# Patient Record
Sex: Male | Born: 1960 | Race: White | Hispanic: No | Marital: Married | Smoking: Never smoker
Health system: Southern US, Community
[De-identification: ages and names within clinical notes are randomized; demographics above are authoritative.]

## PROBLEM LIST (undated history)

## (undated) ENCOUNTER — Encounter

## (undated) ENCOUNTER — Ambulatory Visit

## (undated) ENCOUNTER — Ambulatory Visit
Payer: PRIVATE HEALTH INSURANCE | Attending: Student in an Organized Health Care Education/Training Program | Primary: Student in an Organized Health Care Education/Training Program

## (undated) ENCOUNTER — Telehealth

## (undated) ENCOUNTER — Telehealth: Attending: Clinical | Primary: Clinical

## (undated) DIAGNOSIS — Z7289 Other problems related to lifestyle: Secondary | ICD-10-CM

## (undated) DIAGNOSIS — IMO0001 Reserved for inherently not codable concepts without codable children: Secondary | ICD-10-CM

## (undated) DIAGNOSIS — Z531 Procedure and treatment not carried out because of patient's decision for reasons of belief and group pressure: Secondary | ICD-10-CM

## (undated) DIAGNOSIS — K3189 Other diseases of stomach and duodenum: Secondary | ICD-10-CM

## (undated) DIAGNOSIS — Z789 Other specified health status: Secondary | ICD-10-CM

## (undated) DIAGNOSIS — L719 Rosacea, unspecified: Secondary | ICD-10-CM

## (undated) DIAGNOSIS — F1090 Alcohol use, unspecified, uncomplicated: Secondary | ICD-10-CM

## (undated) DIAGNOSIS — D649 Anemia, unspecified: Secondary | ICD-10-CM

## (undated) DIAGNOSIS — E669 Obesity, unspecified: Secondary | ICD-10-CM

## (undated) DIAGNOSIS — R002 Palpitations: Secondary | ICD-10-CM

## (undated) DIAGNOSIS — R091 Pleurisy: Secondary | ICD-10-CM

## (undated) HISTORY — PX: ABDOMINAL SURGERY: SHX537

## (undated) HISTORY — PX: ESOPHAGOGASTRODUODENOSCOPY: SHX1529

## (undated) HISTORY — PX: TONSILLECTOMY: SUR1361

---
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
## 2016-06-25 DIAGNOSIS — R002 Palpitations: Secondary | ICD-10-CM

## 2016-06-25 DIAGNOSIS — E669 Obesity, unspecified: Secondary | ICD-10-CM

## 2016-06-25 HISTORY — DX: Palpitations: R00.2

## 2016-06-25 HISTORY — DX: Obesity, unspecified: E66.9

## 2017-01-23 DIAGNOSIS — D649 Anemia, unspecified: Secondary | ICD-10-CM

## 2017-01-23 HISTORY — DX: Anemia, unspecified: D64.9

## 2017-01-23 HISTORY — PX: ABDOMINAL SURGERY: SHX537

## 2017-02-04 ENCOUNTER — Encounter (HOSPITAL_COMMUNITY)
Admission: EM | Disposition: A | Discharge status: Other Acute Inpatient Hospital | Payer: Self-pay | Source: Home / Self Care | Attending: Emergency Medicine

## 2017-02-04 ENCOUNTER — Emergency Department (HOSPITAL_COMMUNITY): Payer: BLUE CROSS/BLUE SHIELD

## 2017-02-04 ENCOUNTER — Encounter (HOSPITAL_COMMUNITY): Payer: Self-pay

## 2017-02-04 ENCOUNTER — Observation Stay (HOSPITAL_COMMUNITY)
Admission: EM | Admit: 2017-02-04 | Discharge: 2017-02-05 | Payer: BLUE CROSS/BLUE SHIELD | Attending: Internal Medicine | Admitting: Internal Medicine

## 2017-02-04 ENCOUNTER — Inpatient Hospital Stay (HOSPITAL_COMMUNITY): Payer: BLUE CROSS/BLUE SHIELD | Admitting: Anesthesiology

## 2017-02-04 DIAGNOSIS — R188 Other ascites: Secondary | ICD-10-CM | POA: Insufficient documentation

## 2017-02-04 DIAGNOSIS — E871 Hypo-osmolality and hyponatremia: Principal | ICD-10-CM | POA: Insufficient documentation

## 2017-02-04 DIAGNOSIS — Z789 Other specified health status: Secondary | ICD-10-CM | POA: Diagnosis present

## 2017-02-04 DIAGNOSIS — D649 Anemia, unspecified: Secondary | ICD-10-CM | POA: Diagnosis not present

## 2017-02-04 DIAGNOSIS — R933 Abnormal findings on diagnostic imaging of other parts of digestive tract: Secondary | ICD-10-CM

## 2017-02-04 DIAGNOSIS — K319 Disease of stomach and duodenum, unspecified: Secondary | ICD-10-CM

## 2017-02-04 DIAGNOSIS — R0789 Other chest pain: Secondary | ICD-10-CM | POA: Insufficient documentation

## 2017-02-04 DIAGNOSIS — D490 Neoplasm of unspecified behavior of digestive system: Secondary | ICD-10-CM

## 2017-02-04 DIAGNOSIS — R109 Unspecified abdominal pain: Secondary | ICD-10-CM | POA: Insufficient documentation

## 2017-02-04 DIAGNOSIS — R079 Chest pain, unspecified: Secondary | ICD-10-CM | POA: Insufficient documentation

## 2017-02-04 DIAGNOSIS — K3189 Other diseases of stomach and duodenum: Secondary | ICD-10-CM | POA: Diagnosis not present

## 2017-02-04 DIAGNOSIS — IMO0001 Reserved for inherently not codable concepts without codable children: Secondary | ICD-10-CM | POA: Diagnosis present

## 2017-02-04 DIAGNOSIS — R1010 Upper abdominal pain, unspecified: Secondary | ICD-10-CM | POA: Diagnosis not present

## 2017-02-04 DIAGNOSIS — Z7289 Other problems related to lifestyle: Secondary | ICD-10-CM | POA: Diagnosis present

## 2017-02-04 DIAGNOSIS — D72829 Elevated white blood cell count, unspecified: Secondary | ICD-10-CM | POA: Diagnosis present

## 2017-02-04 DIAGNOSIS — R198 Other specified symptoms and signs involving the digestive system and abdomen: Secondary | ICD-10-CM

## 2017-02-04 HISTORY — DX: Other diseases of stomach and duodenum: K31.89

## 2017-02-04 HISTORY — DX: Anemia, unspecified: D64.9

## 2017-02-04 HISTORY — DX: Reserved for inherently not codable concepts without codable children: IMO0001

## 2017-02-04 HISTORY — DX: Other specified health status: Z78.9

## 2017-02-04 HISTORY — DX: Alcohol use, unspecified, uncomplicated: F10.90

## 2017-02-04 HISTORY — DX: Palpitations: R00.2

## 2017-02-04 HISTORY — DX: Procedure and treatment not carried out because of patient's decision for reasons of belief and group pressure: Z53.1

## 2017-02-04 HISTORY — DX: Pleurisy: R09.1

## 2017-02-04 HISTORY — DX: Rosacea, unspecified: L71.9

## 2017-02-04 HISTORY — DX: Obesity, unspecified: E66.9

## 2017-02-04 HISTORY — PX: ESOPHAGOGASTRODUODENOSCOPY: SHX5428

## 2017-02-04 HISTORY — DX: Other problems related to lifestyle: Z72.89

## 2017-02-04 LAB — DIFFERENTIAL
BASOS ABS: 0 10*3/uL (ref 0.0–0.1)
Basophils Relative: 0 %
EOS PCT: 1 %
Eosinophils Absolute: 0.1 10*3/uL (ref 0.0–0.7)
LYMPHS ABS: 1.3 10*3/uL (ref 0.7–4.0)
LYMPHS PCT: 9 %
Monocytes Absolute: 1.1 10*3/uL — ABNORMAL HIGH (ref 0.1–1.0)
Monocytes Relative: 7 %
NEUTROS ABS: 12.5 10*3/uL — AB (ref 1.7–7.7)
NEUTROS PCT: 83 %

## 2017-02-04 LAB — OSMOLALITY: Osmolality: 267 mOsm/kg — ABNORMAL LOW (ref 275–295)

## 2017-02-04 LAB — GLUCOSE, CAPILLARY: GLUCOSE-CAPILLARY: 129 mg/dL — AB (ref 65–99)

## 2017-02-04 LAB — APTT: APTT: 25 s (ref 24–36)

## 2017-02-04 LAB — CBC
HEMATOCRIT: 28.8 % — AB (ref 39.0–52.0)
Hemoglobin: 9.5 g/dL — ABNORMAL LOW (ref 13.0–17.0)
MCH: 27.7 pg (ref 26.0–34.0)
MCHC: 33 g/dL (ref 30.0–36.0)
MCV: 84 fL (ref 78.0–100.0)
PLATELETS: 181 10*3/uL (ref 150–400)
RBC: 3.43 MIL/uL — ABNORMAL LOW (ref 4.22–5.81)
RDW: 14.5 % (ref 11.5–15.5)
WBC: 15.1 10*3/uL — AB (ref 4.0–10.5)

## 2017-02-04 LAB — HEPATIC FUNCTION PANEL
ALT: 16 U/L — ABNORMAL LOW (ref 17–63)
AST: 29 U/L (ref 15–41)
Albumin: 3.8 g/dL (ref 3.5–5.0)
Alkaline Phosphatase: 38 U/L (ref 38–126)
Bilirubin, Direct: 0.1 mg/dL (ref 0.1–0.5)
Indirect Bilirubin: 0.7 mg/dL (ref 0.3–0.9)
Total Bilirubin: 0.8 mg/dL (ref 0.3–1.2)
Total Protein: 6.6 g/dL (ref 6.5–8.1)

## 2017-02-04 LAB — BASIC METABOLIC PANEL
Anion gap: 11 (ref 5–15)
BUN: 8 mg/dL (ref 6–20)
CO2: 22 mmol/L (ref 22–32)
Calcium: 8.2 mg/dL — ABNORMAL LOW (ref 8.9–10.3)
Chloride: 92 mmol/L — ABNORMAL LOW (ref 101–111)
Creatinine, Ser: 0.74 mg/dL (ref 0.61–1.24)
GFR calc Af Amer: >60 mL/min (ref >60)
GFR calc non Af Amer: >60 mL/min (ref >60)
Glucose, Bld: 156 mg/dL — ABNORMAL HIGH (ref 65–99)
Potassium: 3.6 mmol/L (ref 3.5–5.1)
Sodium: 125 mmol/L — ABNORMAL LOW (ref 135–145)

## 2017-02-04 LAB — I-STAT TROPONIN, ED: Troponin i, poc: 0 ng/mL (ref 0.00–0.08)

## 2017-02-04 LAB — SODIUM, URINE, RANDOM: Sodium, Ur: 39 mmol/L

## 2017-02-04 LAB — TYPE AND SCREEN
ABO/RH(D): O NEG
ANTIBODY SCREEN: NEGATIVE

## 2017-02-04 LAB — OSMOLALITY, URINE: OSMOLALITY UR: 350 mosm/kg (ref 300–900)

## 2017-02-04 LAB — PROTIME-INR
INR: 1.06
PROTHROMBIN TIME: 13.9 s (ref 11.4–15.2)

## 2017-02-04 LAB — HIV ANTIBODY (ROUTINE TESTING W REFLEX): HIV Screen 4th Generation wRfx: NONREACTIVE

## 2017-02-04 LAB — ABO/RH: ABO/RH(D): O NEG

## 2017-02-04 LAB — POC OCCULT BLOOD, ED: Fecal Occult Bld: NEGATIVE

## 2017-02-04 LAB — LIPASE, BLOOD: LIPASE: 29 U/L (ref 11–51)

## 2017-02-04 SURGERY — EGD (ESOPHAGOGASTRODUODENOSCOPY)
Anesthesia: Monitor Anesthesia Care

## 2017-02-04 MED ORDER — ONDANSETRON HCL 4 MG/2ML IJ SOLN: 4.0000 mg | Freq: Three times a day (TID) | INTRAMUSCULAR | Status: DC | PRN

## 2017-02-04 MED ORDER — PIPERACILLIN-TAZOBACTAM 3.375 G IVPB 30 MIN
3.3750 g | Freq: Once | INTRAVENOUS | Status: AC
  Administered 2017-02-04: 3.375 g via INTRAVENOUS
  Filled 2017-02-04: qty 50

## 2017-02-04 MED ORDER — LORAZEPAM 1 MG PO TABS: 1.0000 mg | ORAL_TABLET | Freq: Four times a day (QID) | ORAL | Status: DC | PRN

## 2017-02-04 MED ORDER — IOPAMIDOL (ISOVUE-300) INJECTION 61%
INTRAVENOUS | Status: AC
  Administered 2017-02-04: 100 mL
  Filled 2017-02-04: qty 100

## 2017-02-04 MED ORDER — LORAZEPAM 2 MG/ML IJ SOLN: 1.0000 mg | Freq: Four times a day (QID) | INTRAMUSCULAR | Status: DC | PRN

## 2017-02-04 MED ORDER — ADULT MULTIVITAMIN W/MINERALS CH: 1.0000 | ORAL_TABLET | Freq: Every day | ORAL | Status: DC

## 2017-02-04 MED ORDER — MORPHINE SULFATE (PF) 4 MG/ML IV SOLN: 2.0000 mg | INTRAVENOUS | Status: DC | PRN

## 2017-02-04 MED ORDER — ASPIRIN 81 MG PO CHEW
324.0000 mg | CHEWABLE_TABLET | Freq: Once | ORAL | Status: AC
Start: 2017-02-04 — End: 2017-02-04
  Administered 2017-02-04: 324 mg via ORAL
  Filled 2017-02-04: qty 4

## 2017-02-04 MED ORDER — LORAZEPAM 2 MG/ML IJ SOLN
0.0000 mg | Freq: Four times a day (QID) | INTRAMUSCULAR | Status: DC
Start: 2017-02-04 — End: 2017-02-05
  Administered 2017-02-04: 1 mg via INTRAVENOUS
  Filled 2017-02-04: qty 1

## 2017-02-04 MED ORDER — FOLIC ACID 1 MG PO TABS: 1.0000 mg | ORAL_TABLET | Freq: Every day | ORAL | Status: DC

## 2017-02-04 MED ORDER — SODIUM CHLORIDE 0.9 % IV SOLN
INTRAVENOUS | Status: DC
  Administered 2017-02-04 – 2017-02-05 (×2): via INTRAVENOUS

## 2017-02-04 MED ORDER — PIPERACILLIN-TAZOBACTAM 3.375 G IVPB 30 MIN: 3.3750 g | Freq: Three times a day (TID) | INTRAVENOUS | Status: DC

## 2017-02-04 MED ORDER — ACETAMINOPHEN 650 MG RE SUPP
650.0000 mg | Freq: Four times a day (QID) | RECTAL | Status: DC | PRN
Start: 2017-02-04 — End: 2017-02-05

## 2017-02-04 MED ORDER — LORAZEPAM 2 MG/ML IJ SOLN
0.0000 mg | Freq: Two times a day (BID) | INTRAMUSCULAR | Status: DC
Start: 2017-02-06 — End: 2017-02-05

## 2017-02-04 MED ORDER — ONDANSETRON HCL 4 MG/2ML IJ SOLN
4.0000 mg | Freq: Once | INTRAMUSCULAR | Status: AC
  Administered 2017-02-04: 4 mg via INTRAVENOUS
  Filled 2017-02-04: qty 2

## 2017-02-04 MED ORDER — VITAMIN B-1 100 MG PO TABS
100.0000 mg | ORAL_TABLET | Freq: Every day | ORAL | Status: DC
  Administered 2017-02-05: 100 mg via ORAL
  Filled 2017-02-04: qty 1

## 2017-02-04 MED ORDER — THIAMINE HCL 100 MG/ML IJ SOLN
100.0000 mg | Freq: Every day | INTRAMUSCULAR | Status: DC
  Filled 2017-02-04: qty 2

## 2017-02-04 MED ORDER — FOLIC ACID 5 MG/ML IJ SOLN
1.0000 mg | Freq: Every day | INTRAMUSCULAR | Status: DC
  Filled 2017-02-04 (×2): qty 0.2

## 2017-02-04 MED ORDER — PIPERACILLIN-TAZOBACTAM 3.375 G IVPB
3.3750 g | Freq: Three times a day (TID) | INTRAVENOUS | Status: DC
  Administered 2017-02-04 – 2017-02-05 (×5): 3.375 g via INTRAVENOUS
  Filled 2017-02-04 (×6): qty 50

## 2017-02-04 MED ORDER — MORPHINE SULFATE (PF) 4 MG/ML IV SOLN
4.0000 mg | Freq: Once | INTRAVENOUS | Status: AC
  Administered 2017-02-04: 4 mg via INTRAVENOUS
  Filled 2017-02-04: qty 1

## 2017-02-04 MED ORDER — PROPOFOL 500 MG/50ML IV EMUL
INTRAVENOUS | Status: DC | PRN
  Administered 2017-02-04: 100 ug/kg/min via INTRAVENOUS

## 2017-02-04 NOTE — Transfer of Care (Signed)
Immediate Anesthesia Transfer of Care Note  Patient: Samuel Clarke  Procedure(s) Performed: Procedure(s): ESOPHAGOGASTRODUODENOSCOPY (EGD) (N/A)  Patient Location: Endoscopy Unit  Anesthesia Type:General  Level of Consciousness: awake, alert  and oriented  Airway & Oxygen Therapy: Patient Spontanous Breathing and Patient connected to nasal cannula oxygen  Post-op Assessment: Report given to RN, Post -op Vital signs reviewed and stable and Patient moving all extremities  Post vital signs: Reviewed and stable  Last Vitals:  Vitals:   02/04/17 1300 02/04/17 1359  BP: (!) 155/68 (!) 128/54  Pulse: 84 97  Resp: 18 (!) 23  Temp: 37.1 C   SpO2: 98% 100%    Last Pain:  Vitals:   02/04/17 1300  TempSrc: Oral  PainSc:          Complications: No apparent anesthesia complications

## 2017-02-04 NOTE — Progress Notes (Signed)
PROGRESS NOTE    Samuel Clarke  YQM:[id number] DOB: 03-Jan-1961 DOA: 02/04/2017 PCP: Caroline More, MD   Chief Complaint  Patient presents with  . Chest Pain  . Shortness of Breath    Brief Narrative:  HPI on 02/04/2017 by Dr. Lorretta Harp Kodey [name] is a 56 y.o. male with medical history significant of Jehovah's witness, alcohol use, who presents with chest pain and abdominal pain. Pt is a poor historian. Pt states that he has been having chest discomfort for the whole day. He went out to dinner and had some pasta. After that, he states it felt like there was a lump of concrete in his left upper abdomen. He has nausea, no vomiting or diarrhea. He has pain in the lower chest and upper abdomen currently. The pain is constant, 8 out of 10 in severity, nonradiating. It is not aggravated or alleviated by any known factors. He also has some pain in his lower back. Patient denies shortness breath, cough, fever or chills. No symptoms of UTI or unilateral weakness. No hematochezia, hematemesis or hematuria. Patient states that he drinks 3 beers, mostly every day. He does not smoke cigarettes.  Assessment & Plan   Patient admitted earlier today by Dr. Lorretta Harp. See H&P for details  Abdominal pain/ Gastric mass with perforation -Presented with upper abdominal pain -CT abdomen and pelvis showed a large 7.6 x 7.3 x 6.3 cm mass posterior aspect of the gastric fundus suspicious for malignancy with associated perforation, given convex ascites throughout the abdomen and pelvis -General surgery and gastroenterology consulted and appreciated, pending further recommendations  -Gastroenterology possibly planning for EGD this afternoon -Currently NPO -continue IV fluids, will decrease -Continue antiemetics and pain control as needed  Hyponatremia -Sodium currently 129, improved from 125 on admission -Currently with no neurological deficits -Possibly due to alcohol use -TSH pending -Continue IV  fluids and monitor BMP  Alcohol use -Patient does appear to have mild tremor of his hands -Drinks 3 beers per evening -Continue CIWA -Will give IV thiamine and folic acid -hold multivitamin as patient is NPO  Normocytic anemia -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 surgery Gastroenterology  Procedures  None   LOS: 0 days   Time Spent in minutes   30 minutes  Ziare Orrick D.O. on 02/04/2017 at 1:09 PM  Between 7am to 7pm - Pager - [phone]  After 7pm go to [email or web address] - password [email or web address]  And look for the night coverage person covering for me after hours  [facility] Office  [phone]

## 2017-02-04 NOTE — Op Note (Signed)
[facility] Patient Name: Samuel Clarke Procedure Date : 02/04/2017 MRN: [id number] Attending MD: Meryl Dare , MD Date of Birth: 1961/01/27 CSN: [id number] Age: 56 Admit Type: Outpatient Procedure:                Upper GI endoscopy Indications:              Abnormal CT of the GI tract Providers:                Venita Lick. Russella Dar, MD, Jacquiline Doe, RN, Jacqulyn Liner, Technician Referring MD:             Suzanna Obey, MD Medicines:                Monitored Anesthesia Care Complications:            No immediate complications. Estimated Blood Loss:     Estimated blood loss was minimal. Procedure:                Pre-Anesthesia Assessment:                           - Prior to the procedure, a History and Physical                            was performed, and patient medications and                            allergies were reviewed. The patient's tolerance of                            previous anesthesia was also reviewed. The risks                            and benefits of the procedure and the sedation                            options and risks were discussed with the patient.                            All questions were answered, and informed consent                            was obtained. Prior Anticoagulants: The patient has                            taken no previous anticoagulant or antiplatelet                            agents. ASA Grade Assessment: III - A patient with                            severe systemic disease. After reviewing the risks  and benefits, the patient was deemed in                            satisfactory condition to undergo the procedure.                           After obtaining informed consent, the endoscope was                            passed under direct vision. Throughout the                            procedure, the patient's blood pressure, pulse, and                             oxygen saturations were monitored continuously. The                            EG-[id number] ([id number]) scope was introduced through the                            mouth, and advanced to the second part of duodenum.                            The upper GI endoscopy was accomplished without                            difficulty. The patient tolerated the procedure                            well. Scope In: Scope Out: Findings:      The examined esophagus was normal.      A large, submucosal mass with friable overlying mucosa with some oozing       following biopsy in the gastric fundus. Biopsy of mucosa overlying the       submucosal mass was taken with a cold forceps for histology-I doubt this       will provide a diagnosis.      Diffuse mildly erythematous mucosa without bleeding was found in the       gastric fundus and in the gastric body.      The exam of the stomach was otherwise normal.      The duodenal bulb and second portion of the duodenum were normal. Impression:               - Normal esophagus.                           - Rule out malignancy, submucosal gastric tumor in                            the gastric fundus. Mucosa overlying the mass was                            biopsied.                           -  Erythematous mucosa in the gastric fundus and                            gastric body.                           - Normal duodenal bulb and second portion of the                            duodenum. Moderate Sedation:      none/MAC Recommendation:           - Return patient to hospital ward for ongoing care.                           - Clear liquid diet today.                           - Continue present medications.                           - Await pathology results.                           - No aspirin, ibuprofen, naproxen, or other                            non-steroidal anti-inflammatory drugs for 2 weeks.                           - Surgical  mgmt. Procedure Code(s):        --- Professional ---                           [phone], Esophagogastroduodenoscopy, flexible,                            transoral; with biopsy, single or multiple Diagnosis Code(s):        --- Professional ---                           D49.0, Neoplasm of unspecified behavior of                            digestive system                           K31.89, Other diseases of stomach and duodenum                           R93.3, Abnormal findings on diagnostic imaging of                            other parts of digestive tract CPT copyright 2016 American Medical Association. All rights reserved. The codes documented in this report are preliminary and upon coder review may  be revised to meet current compliance requirements. Meryl Dare, MD 02/04/2017 2:00:11 PM This report has been  signed electronically. Number of Addenda: 0

## 2017-02-04 NOTE — ED Triage Notes (Signed)
Patient with chest pain that started earlier today.  Patient states that he went out to eat and the food became like a "cement block" in his stomach and vomited a couple of times this evening.  He states that he was uncomfortable, having back pain during this episode.  Patient was diaphoretic during this episodes.  He initially drank some seltzer water to help with the feeling of belching which did help.  He also felt syncopal and wife stated that he almost passed out at one point.

## 2017-02-04 NOTE — Anesthesia Procedure Notes (Signed)
Procedure Name: MAC Date/Time: 02/04/2017 1:37 PM Performed by: Kyung Rudd Pre-anesthesia Checklist: Patient identified, Emergency Drugs available, Suction available and Patient being monitored Patient Re-evaluated:Patient Re-evaluated prior to induction Oxygen Delivery Method: Nasal cannula Induction Type: IV induction Placement Confirmation: positive ETCO2 Dental Injury: Teeth and Oropharynx as per pre-operative assessment

## 2017-02-04 NOTE — Consult Note (Signed)
[facility] Consult: 11:50 AM 02/04/2017  LOS: 0 days    Referring Provider: Dr. Georgette Dover  Primary Care Physician:  Bettey Mare, MD at outpatient [facility] clinic in [place]. Primary Gastroenterologist:  Althia Forts    Reason for Consultation:  Gastric mass.  Dr Georgette Dover requests upper endoscopy for evaluation   HPI: Samuel Clarke is a 56 y.o. male.  He is a Restaurant manager, fast food. PMH of obesity.  History cardiac palpitations in 06/2016. These resolved and plans for what sounds like outpatient Holter monitor were canceled.  Remote tonsillectomy as a 23 or 23-year-old.  GI-wise patient has no previous history and no previous symptoms. Earlier this year, he went on a weight loss diet and lost about 20 pounds but this stabilized. Yesterday morning patient felt some slight pressure in his chest and possible shortness of breath. After church he ate fettuccine Alfredo at the [employer] at which point the lower chest pain recurred and radiated into his left upper quadrant, shoulders and back.Marland Kitchen He felt bloated. He wasn't particularly nauseated but he felt like the food had just stuck in his stomach and that if he could throw up he would feel better.  When he got home he drank a bunch of water and try to bring up gastric contents but all he was able to produce was some small amount of phlegm but he felt a little bit better. Later in the afternoon he went and sat on the commode and had a syncopal spell. He briefly lost consciousness and slid off the toilet. At that point the pain was in the left upper abdomen and no longer in the chest. He was diaphoretic.  Contrasted CT abdomen pelvis with 7.6 x 7.3 x 6.3 cm gastric mass in the posterior fundus, diffuse gastric wall thickening.  Mass is suspicious for malignancy. Possible perforation  given presence of complex, small to moderate volume ascites throughout abdomen and pelvis.  Radiology unable to distinguish if ascites represents gastric contents or blood. Soft tissue inflammation tracking near the splenic flexure of colon likely reflecting mesenteric edema secondary to perforation. HGB 9.5, MCV 84.  Hemoglobin 13 point 5 on 07/20/16.  Coags and platelets unremarkable. Sodium low at 125. Glucose elevated 156. LFTs, Lipase not elevated.  FOBT negative. Dr. Grandville Silos of general surgery has seen the patient and feels that the mass has not perforated and the fluid represents hemoperitoneum.  Patient doesn't use nonsteroidal anti-inflammatories. He drinks 3 beers per night. He thinks his mother may have had problems with stomach ulcers but isn't sure. He himself has never undergone colonoscopy or upper endoscopy.  Stool FOBT testing in 07/2016 reveals FOBT was negative   Past Medical History:  Diagnosis Date  . Alcohol use   . Heart palpitations 06/2016  . Normocytic anemia 01/2017  . Obesity 06/2016  . Patient is Jehovah's Witness     Past Surgical History:  Procedure Laterality Date  . TONSILLECTOMY     At age 73 or 7    Prior to Admission medications   Not on File  Scheduled Meds: . folic acid  1 mg Intravenous Daily  . LORazepam  0-4 mg Intravenous Q6H   Followed by  . [START ON 02/06/2017] LORazepam  0-4 mg Intravenous Q12H  . thiamine  100 mg Oral Daily   Or  . thiamine  100 mg Intravenous Daily   Infusions: . sodium chloride 125 mL/hr at 02/04/17 0640  . piperacillin-tazobactam (ZOSYN)  IV     PRN Meds: acetaminophen, LORazepam **OR** LORazepam, morphine injection, ondansetron (ZOFRAN) IV   Allergies as of 02/04/2017  . (No Known Allergies)    Family History  Problem Relation Age of Onset  . Diabetes Mellitus II Mother   . Transient ischemic attack Father   . Diabetes Mellitus II Sister     Social History   Social History  . Marital status:  Married    Spouse name: N/A  . Number of children: N/A  . Years of education: N/A   Occupational History  . Not on file.   Social History Main Topics  . Smoking status: Never Smoker  . Smokeless tobacco: Not on file  . Alcohol use Yes  . Drug use: No  . Sexual activity: Not on file   Other Topics Concern  . Not on file   Social History Narrative  . No narrative on file    REVIEW OF SYSTEMS: Constitutional:  Per HPI.  Patient generally has no weakness or fatigue. 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 ENT:  No nose bleeds Pulm:  No shortness of breath or cough. CV:  No palpitations, no LE edema. No chest pain since early yesterday. GU:  No hematuria, no frequency GI:  Per HPI Heme:  No unusual bleeding or bruising.   Transfusions:  No previous blood transfusions. He has a Sales promotion account executive Witness and thus would never consent to receiving blood products. Neuro: Presyncope as described above No headaches, no peripheral tingling or numbness Derm:  No itching, no rash or sores.  Endocrine:  No sweats or chills.  No polyuria or dysuria Immunization:  Did not inquire as to recent or previous immunization. Travel:  None beyond local counties in last few months.    PHYSICAL EXAM: Vital signs in last 24 hours: Vitals:   02/04/17 0530 02/04/17 0609  BP: 118/67 121/63  Pulse: 72 79  Resp: (!) 25 16  Temp:  98.5 F (36.9 C)  SpO2: 100% 100%   Wt Readings from Last 3 Encounters:  No data found for Wt    General: Obese, alert, pleasant, comfortable, not ill appearing WM Head:  No asymmetry or facial swelling. There is a reddened patch about the size of a dollar coin on his right cheek  Eyes:  No scleral icterus. No conjunctival pallor. EOMI. Ears:  Not hard of hearing  Nose:  No congestion, no discharge Mouth:  Tongue midline. Oral mucosa moist and clear. Dentition good. Neck:  No JVD, no masses, no TMG Lungs:  Clear bilaterally. No  shortness of breath. No cough. Heart: RRR. No MRG. S1, S2 present Abdomen:  Obese. Soft. No palpable masses. No HSM. Bowel sounds active. No hernias. Tenderness at the epigastric region and on the right upper quadrant more so than the left upper quadrant. No guarding or rebound.   Rectal: Deferred   Musc/Skeltl: No joint redness, swelling or gross deformity Extremities:  No CCE.  Neurologic:  Alert. Oriented times 3. Good historian. Moves all 4 limbs. No tremor or gross weakness. Skin:  No telangiectasia or suspicious lesions. No significant hematoma/bruising Tattoos:  None observed Nodes:  No cervical adenopathy.   Psych:  Pleasant, calm, cooperative.  Intake/Output from previous day: 08/12 0701 - 08/13 0700 In: 50 [IV Piggyback:50] Out: 200 [Urine:200] Intake/Output this shift: Total I/O In: -  Out: 600 [Urine:600]  LAB RESULTS:  Recent Labs  02/04/17 0231  WBC 15.1*  HGB 9.5*  HCT 28.8*  PLT 181   BMET Lab Results  Component Value Date   NA 125 (L) 02/04/2017   K 3.6 02/04/2017   CL 92 (L) 02/04/2017   CO2 22 02/04/2017   GLUCOSE 156 (H) 02/04/2017   BUN 8 02/04/2017   CREATININE 0.74 02/04/2017   CALCIUM 8.2 (L) 02/04/2017   LFT  Recent Labs  02/04/17 0213  PROT 6.6  ALBUMIN 3.8  AST 29  ALT 16*  ALKPHOS 38  BILITOT 0.8  BILIDIR 0.1  IBILI 0.7   PT/INR Lab Results  Component Value Date   INR 1.06 02/04/2017   Lipase     Component Value Date/Time   LIPASE 29 02/04/2017 0213    Drugs of Abuse  No results found for: LABOPIA, COCAINSCRNUR, LABBENZ, AMPHETMU, THCU, LABBARB   RADIOLOGY STUDIES: Ct Abdomen Pelvis W Contrast  Result Date: 02/04/2017 CLINICAL DATA:  Acute onset of vomiting and generalized chest pain. Diaphoresis and near syncope. Initial encounter. EXAM: CT ABDOMEN AND PELVIS WITH CONTRAST TECHNIQUE: Multidetector CT imaging of the abdomen and pelvis was performed using the standard protocol following bolus administration of  intravenous contrast. CONTRAST:  183mL ISOVUE-300 IOPAMIDOL (ISOVUE-300) INJECTION 61% COMPARISON:  None. FINDINGS: Lower chest: The visualized lung bases are clear. Scattered coronary artery calcifications are seen. Hepatobiliary: The liver is unremarkable in appearance. The gallbladder is unremarkable in appearance. The common bile duct remains normal in caliber. Mild complex ascites is seen tracking about the liver. Pancreas: The pancreas is grossly unremarkable in appearance. Diffuse soft tissue edema is noted tracking about the pancreas, though it appears separate from the pancreas. Spleen: Complex fluid is noted tracking about the spleen. The spleen is grossly unremarkable. Adrenals/Urinary Tract: The adrenal glands are grossly unremarkable in appearance, though the left adrenal gland is not well assessed due to the adjacent mass described below. The kidneys are unremarkable in appearance. There is no evidence of hydronephrosis. No renal or ureteral stones are identified. No perinephric stranding is seen. Stomach/Bowel: There is a large 7.6 x 7.3 x 6.3 cm mass noted at the posterior aspect of the gastric fundus. On sagittal images, this appears to arise within the gastric wall. This is suspicious for malignancy, with associated perforation, given complex ascites throughout the abdomen and pelvis. It is difficult to determine whether the ascites reflects primarily gastric contents or blood. There is no evidence of active contrast extravasation at this time. There appears to be diffuse gastric wall thickening, concerning for underlying gastritis. The small bowel is grossly unremarkable in appearance, aside from fluid tracking about small bowel loops. The appendix is normal in caliber, without evidence of appendicitis. The colon is unremarkable in appearance. Soft tissue inflammation tracks about the splenic flexure of the colon, reflecting underlying mesenteric edema secondary to the perforation.  Vascular/Lymphatic: Scattered calcification is seen along the abdominal aorta and its branches. The abdominal aorta is otherwise grossly unremarkable. The inferior vena cava is grossly unremarkable. No retroperitoneal lymphadenopathy is seen. No pelvic sidewall lymphadenopathy is identified. Reproductive: The bladder is mildly distended and grossly unremarkable. The prostate remains normal in size. Other:  As described above, there is small to moderate volume complex relatively high attenuation ascites within the abdomen and pelvis. This could reflect gastric contents, though the lack of free intraperitoneal air is unusual. Alternatively, it could reflect hemorrhage from the gastric mass. Musculoskeletal: No acute osseous abnormalities are identified. The visualized musculature is unremarkable in appearance. IMPRESSION: 1. Large 7.6 x 7.3 x 6.3 cm mass at the posterior aspect of the gastric fundus. On sagittal images, this appears to arise within the gastric wall. This is suspicious for malignancy, with associated perforation, given complex ascites throughout the abdomen and pelvis. 2. It is difficult to determine whether the small to moderate volume complex ascites reflects primarily gastric contents or blood. The lack of free intraperitoneal air suggests against gastric contents. Hemorrhage from the gastric mass could have such an appearance. 3. Diffuse soft tissue edema tracks about the mesentery of the upper abdomen, likely reflecting the perforation of the adjacent gastric mass. 4. Scattered coronary artery calcifications noted. 5. Scattered aortic atherosclerosis. Critical Value/emergent results were called by telephone at the time of interpretation on 02/04/2017 at 4:25 am to Dr. Delora Fuel, who verbally acknowledged these results. Electronically Signed   By: Garald Balding M.D.   On: 02/04/2017 04:37   Dg Chest Portable 1 View  Result Date: 02/04/2017 CLINICAL DATA:  Chest pain with shortness of breath  EXAM: PORTABLE CHEST 1 VIEW COMPARISON:  None. FINDINGS: The heart size and mediastinal contours are within normal limits. Both lungs are clear. The visualized skeletal structures are unremarkable. IMPRESSION: No active disease. Electronically Signed   By: Donavan Foil M.D.   On: 02/04/2017 02:48    ENDOSCOPIC STUDIES: None ever  IMPRESSION:   *  Symptomatic gastric mass. Question perforation versus associated bleeding and hematoma  *  Normocytic anemia.  *  Hyponatremia    PLAN:     *  EGD this afternoon. Risk of causing and/or exacerbating existing perforation discussed with the patient and his wife but they are agreeable to proceed. Patient has been npo.     Azucena Freed  02/04/2017, 11:50 AM Pager: [phone]     Attending physician's note   I have taken a history, examined the patient and reviewed the chart. I agree with the Advanced Practitioner's note, impression and recommendations. Large gastric fundus mass with possible intrapertoneal hemorrhage, penetration , perforation. Normocytic anemia. Hyponatremia. EGD today to further evaluate.   Lucio Edward, MD [facility] [phone] Mon-Fri 8a-5p [phone] after 5p, weekends, holidays

## 2017-02-04 NOTE — H&P (View-Only) (Signed)
[facility] Consult: 11:50 AM 02/04/2017  LOS: 0 days    Referring Provider: Dr. Georgette Dover  Primary Care Physician:  Bettey Mare, MD at outpatient [facility] clinic in [place]. Primary Gastroenterologist:  Althia Forts    Reason for Consultation:  Gastric mass.  Dr Georgette Dover requests upper endoscopy for evaluation   HPI: Samuel Clarke is a 56 y.o. male.  He is a Restaurant manager, fast food. PMH of obesity.  History cardiac palpitations in 06/2016. These resolved and plans for what sounds like outpatient Holter monitor were canceled.  Remote tonsillectomy as a 80 or 76-year-old.  GI-wise patient has no previous history and no previous symptoms. Earlier this year, he went on a weight loss diet and lost about 20 pounds but this stabilized. Yesterday morning patient felt some slight pressure in his chest and possible shortness of breath. After church he ate fettuccine Alfredo at the [employer] at which point the lower chest pain recurred and radiated into his left upper quadrant, shoulders and back.Marland Kitchen He felt bloated. He wasn't particularly nauseated but he felt like the food had just stuck in his stomach and that if he could throw up he would feel better.  When he got home he drank a bunch of water and try to bring up gastric contents but all he was able to produce was some small amount of phlegm but he felt a little bit better. Later in the afternoon he went and sat on the commode and had a syncopal spell. He briefly lost consciousness and slid off the toilet. At that point the pain was in the left upper abdomen and no longer in the chest. He was diaphoretic.  Contrasted CT abdomen pelvis with 7.6 x 7.3 x 6.3 cm gastric mass in the posterior fundus, diffuse gastric wall thickening.  Mass is suspicious for malignancy. Possible perforation  given presence of complex, small to moderate volume ascites throughout abdomen and pelvis.  Radiology unable to distinguish if ascites represents gastric contents or blood. Soft tissue inflammation tracking near the splenic flexure of colon likely reflecting mesenteric edema secondary to perforation. HGB 9.5, MCV 84.  Hemoglobin 13 point 5 on 07/20/16.  Coags and platelets unremarkable. Sodium low at 125. Glucose elevated 156. LFTs, Lipase not elevated.  FOBT negative. Dr. Grandville Silos of general surgery has seen the patient and feels that the mass has not perforated and the fluid represents hemoperitoneum.  Patient doesn't use nonsteroidal anti-inflammatories. He drinks 3 beers per night. He thinks his mother may have had problems with stomach ulcers but isn't sure. He himself has never undergone colonoscopy or upper endoscopy.  Stool FOBT testing in 07/2016 reveals FOBT was negative   Past Medical History:  Diagnosis Date  . Alcohol use   . Heart palpitations 06/2016  . Normocytic anemia 01/2017  . Obesity 06/2016  . Patient is Jehovah's Witness     Past Surgical History:  Procedure Laterality Date  . TONSILLECTOMY     At age 44 or 7    Prior to Admission medications   Not on File  Scheduled Meds: . folic acid  1 mg Intravenous Daily  . LORazepam  0-4 mg Intravenous Q6H   Followed by  . [START ON 02/06/2017] LORazepam  0-4 mg Intravenous Q12H  . thiamine  100 mg Oral Daily   Or  . thiamine  100 mg Intravenous Daily   Infusions: . sodium chloride 125 mL/hr at 02/04/17 0640  . piperacillin-tazobactam (ZOSYN)  IV     PRN Meds: acetaminophen, LORazepam **OR** LORazepam, morphine injection, ondansetron (ZOFRAN) IV   Allergies as of 02/04/2017  . (No Known Allergies)    Family History  Problem Relation Age of Onset  . Diabetes Mellitus II Mother   . Transient ischemic attack Father   . Diabetes Mellitus II Sister     Social History   Social History  . Marital status:  Married    Spouse name: N/A  . Number of children: N/A  . Years of education: N/A   Occupational History  . Not on file.   Social History Main Topics  . Smoking status: Never Smoker  . Smokeless tobacco: Not on file  . Alcohol use Yes  . Drug use: No  . Sexual activity: Not on file   Other Topics Concern  . Not on file   Social History Narrative  . No narrative on file    REVIEW OF SYSTEMS: Constitutional:  Per HPI.  Patient generally has no weakness or fatigue. 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 ENT:  No nose bleeds Pulm:  No shortness of breath or cough. CV:  No palpitations, no LE edema. No chest pain since early yesterday. GU:  No hematuria, no frequency GI:  Per HPI Heme:  No unusual bleeding or bruising.   Transfusions:  No previous blood transfusions. He has a Sales promotion account executive Witness and thus would never consent to receiving blood products. Neuro: Presyncope as described above No headaches, no peripheral tingling or numbness Derm:  No itching, no rash or sores.  Endocrine:  No sweats or chills.  No polyuria or dysuria Immunization:  Did not inquire as to recent or previous immunization. Travel:  None beyond local counties in last few months.    PHYSICAL EXAM: Vital signs in last 24 hours: Vitals:   02/04/17 0530 02/04/17 0609  BP: 118/67 121/63  Pulse: 72 79  Resp: (!) 25 16  Temp:  98.5 F (36.9 C)  SpO2: 100% 100%   Wt Readings from Last 3 Encounters:  No data found for Wt    General: Obese, alert, pleasant, comfortable, not ill appearing WM Head:  No asymmetry or facial swelling. There is a reddened patch about the size of a dollar coin on his right cheek  Eyes:  No scleral icterus. No conjunctival pallor. EOMI. Ears:  Not hard of hearing  Nose:  No congestion, no discharge Mouth:  Tongue midline. Oral mucosa moist and clear. Dentition good. Neck:  No JVD, no masses, no TMG Lungs:  Clear bilaterally. No  shortness of breath. No cough. Heart: RRR. No MRG. S1, S2 present Abdomen:  Obese. Soft. No palpable masses. No HSM. Bowel sounds active. No hernias. Tenderness at the epigastric region and on the right upper quadrant more so than the left upper quadrant. No guarding or rebound.   Rectal: Deferred   Musc/Skeltl: No joint redness, swelling or gross deformity Extremities:  No CCE.  Neurologic:  Alert. Oriented times 3. Good historian. Moves all 4 limbs. No tremor or gross weakness. Skin:  No telangiectasia or suspicious lesions. No significant hematoma/bruising Tattoos:  None observed Nodes:  No cervical adenopathy.   Psych:  Pleasant, calm, cooperative.  Intake/Output from previous day: 08/12 0701 - 08/13 0700 In: 50 [IV Piggyback:50] Out: 200 [Urine:200] Intake/Output this shift: Total I/O In: -  Out: 600 [Urine:600]  LAB RESULTS:  Recent Labs  02/04/17 0231  WBC 15.1*  HGB 9.5*  HCT 28.8*  PLT 181   BMET Lab Results  Component Value Date   NA 125 (L) 02/04/2017   K 3.6 02/04/2017   CL 92 (L) 02/04/2017   CO2 22 02/04/2017   GLUCOSE 156 (H) 02/04/2017   BUN 8 02/04/2017   CREATININE 0.74 02/04/2017   CALCIUM 8.2 (L) 02/04/2017   LFT  Recent Labs  02/04/17 0213  PROT 6.6  ALBUMIN 3.8  AST 29  ALT 16*  ALKPHOS 38  BILITOT 0.8  BILIDIR 0.1  IBILI 0.7   PT/INR Lab Results  Component Value Date   INR 1.06 02/04/2017   Lipase     Component Value Date/Time   LIPASE 29 02/04/2017 0213    Drugs of Abuse  No results found for: LABOPIA, COCAINSCRNUR, LABBENZ, AMPHETMU, THCU, LABBARB   RADIOLOGY STUDIES: Ct Abdomen Pelvis W Contrast  Result Date: 02/04/2017 CLINICAL DATA:  Acute onset of vomiting and generalized chest pain. Diaphoresis and near syncope. Initial encounter. EXAM: CT ABDOMEN AND PELVIS WITH CONTRAST TECHNIQUE: Multidetector CT imaging of the abdomen and pelvis was performed using the standard protocol following bolus administration of  intravenous contrast. CONTRAST:  161mL ISOVUE-300 IOPAMIDOL (ISOVUE-300) INJECTION 61% COMPARISON:  None. FINDINGS: Lower chest: The visualized lung bases are clear. Scattered coronary artery calcifications are seen. Hepatobiliary: The liver is unremarkable in appearance. The gallbladder is unremarkable in appearance. The common bile duct remains normal in caliber. Mild complex ascites is seen tracking about the liver. Pancreas: The pancreas is grossly unremarkable in appearance. Diffuse soft tissue edema is noted tracking about the pancreas, though it appears separate from the pancreas. Spleen: Complex fluid is noted tracking about the spleen. The spleen is grossly unremarkable. Adrenals/Urinary Tract: The adrenal glands are grossly unremarkable in appearance, though the left adrenal gland is not well assessed due to the adjacent mass described below. The kidneys are unremarkable in appearance. There is no evidence of hydronephrosis. No renal or ureteral stones are identified. No perinephric stranding is seen. Stomach/Bowel: There is a large 7.6 x 7.3 x 6.3 cm mass noted at the posterior aspect of the gastric fundus. On sagittal images, this appears to arise within the gastric wall. This is suspicious for malignancy, with associated perforation, given complex ascites throughout the abdomen and pelvis. It is difficult to determine whether the ascites reflects primarily gastric contents or blood. There is no evidence of active contrast extravasation at this time. There appears to be diffuse gastric wall thickening, concerning for underlying gastritis. The small bowel is grossly unremarkable in appearance, aside from fluid tracking about small bowel loops. The appendix is normal in caliber, without evidence of appendicitis. The colon is unremarkable in appearance. Soft tissue inflammation tracks about the splenic flexure of the colon, reflecting underlying mesenteric edema secondary to the perforation.  Vascular/Lymphatic: Scattered calcification is seen along the abdominal aorta and its branches. The abdominal aorta is otherwise grossly unremarkable. The inferior vena cava is grossly unremarkable. No retroperitoneal lymphadenopathy is seen. No pelvic sidewall lymphadenopathy is identified. Reproductive: The bladder is mildly distended and grossly unremarkable. The prostate remains normal in size. Other:  As described above, there is small to moderate volume complex relatively high attenuation ascites within the abdomen and pelvis. This could reflect gastric contents, though the lack of free intraperitoneal air is unusual. Alternatively, it could reflect hemorrhage from the gastric mass. Musculoskeletal: No acute osseous abnormalities are identified. The visualized musculature is unremarkable in appearance. IMPRESSION: 1. Large 7.6 x 7.3 x 6.3 cm mass at the posterior aspect of the gastric fundus. On sagittal images, this appears to arise within the gastric wall. This is suspicious for malignancy, with associated perforation, given complex ascites throughout the abdomen and pelvis. 2. It is difficult to determine whether the small to moderate volume complex ascites reflects primarily gastric contents or blood. The lack of free intraperitoneal air suggests against gastric contents. Hemorrhage from the gastric mass could have such an appearance. 3. Diffuse soft tissue edema tracks about the mesentery of the upper abdomen, likely reflecting the perforation of the adjacent gastric mass. 4. Scattered coronary artery calcifications noted. 5. Scattered aortic atherosclerosis. Critical Value/emergent results were called by telephone at the time of interpretation on 02/04/2017 at 4:25 am to Dr. Delora Fuel, who verbally acknowledged these results. Electronically Signed   By: Garald Balding M.D.   On: 02/04/2017 04:37   Dg Chest Portable 1 View  Result Date: 02/04/2017 CLINICAL DATA:  Chest pain with shortness of breath  EXAM: PORTABLE CHEST 1 VIEW COMPARISON:  None. FINDINGS: The heart size and mediastinal contours are within normal limits. Both lungs are clear. The visualized skeletal structures are unremarkable. IMPRESSION: No active disease. Electronically Signed   By: Donavan Foil M.D.   On: 02/04/2017 02:48    ENDOSCOPIC STUDIES: None ever  IMPRESSION:   *  Symptomatic gastric mass. Question perforation versus associated bleeding and hematoma  *  Normocytic anemia.  *  Hyponatremia    PLAN:     *  EGD this afternoon. Risk of causing and/or exacerbating existing perforation discussed with the patient and his wife but they are agreeable to proceed. Patient has been npo.     Azucena Freed  02/04/2017, 11:50 AM Pager: [phone]     Attending physician's note   I have taken a history, examined the patient and reviewed the chart. I agree with the Advanced Practitioner's note, impression and recommendations. Large gastric fundus mass with possible intrapertoneal hemorrhage, penetration , perforation. Normocytic anemia. Hyponatremia. EGD today to further evaluate.   Lucio Edward, MD [facility] [phone] Mon-Fri 8a-5p [phone] after 5p, weekends, holidays

## 2017-02-04 NOTE — Anesthesia Postprocedure Evaluation (Signed)
Anesthesia Post Note  Patient: Samuel Clarke  Procedure(s) Performed: Procedure(s) (LRB): ESOPHAGOGASTRODUODENOSCOPY (EGD) (N/A)     Patient location during evaluation: PACU Anesthesia Type: MAC Level of consciousness: awake and alert Pain management: pain level controlled Vital Signs Assessment: post-procedure vital signs reviewed and stable Respiratory status: spontaneous breathing, nonlabored ventilation, respiratory function stable and patient connected to nasal cannula oxygen Cardiovascular status: stable and blood pressure returned to baseline Anesthetic complications: no    Last Vitals:  Vitals:   02/04/17 1420 02/04/17 1513  BP: (!) 127/56 129/66  Pulse: 82 87  Resp: 19 16  Temp: 37.3 C 37.2 C  SpO2: 99% 100%    Last Pain:  Vitals:   02/04/17 1513  TempSrc: Oral  PainSc:                  Audry Pili

## 2017-02-04 NOTE — Progress Notes (Signed)
Samuel Clarke [id number] Admitted to 5W10: 02/04/2017 6:12 AM Attending Provider: Ivor Costa, MD    Samuel Clarke is a 56 y.o. male patient admitted from ED awake, alert  & orientated  X 3,  Full Code, VSS - Blood pressure 121/63, pulse 79, temperature 98.5 F (36.9 C), temperature source Oral, resp. rate 16, SpO2 100 %., O2  RA, no c/o shortness of breath, no c/o chest pain, no distress noted. Tele # [phone] placed and pt is currently running: SR   IV site WDL:  with a transparent dsg that's clean dry and intact.  Allergies:  No Known Allergies   Past Medical History:  Diagnosis Date  . Alcohol use   . Patient is Jehovah's Witness     History:  obtained from patient  Pt orientation to unit, room and routine. Information packet given to patient.  Admission INP armband ID verified with patient, and in place. SR up x 2, fall risk assessment complete with Patient and family verbalizing understanding of risks associated with falls. Pt verbalizes an understanding of how to use the call bell and to call for help before getting out of bed.  Skin, clean-dry- intact without evidence of bruising, or skin tears.   No evidence of skin break down noted on exam.  Will cont to monitor and assist as needed.  Parthenia Ames, RN 02/04/2017 6:12 AM

## 2017-02-04 NOTE — Progress Notes (Signed)
Spoke with Dr. Grandville Silos and Dr. Gershon Crane this AM regarding patients case. I have called Azucena Freed, PA-C of [facility] for consult regarding possible EGD prior to further surgical planning.  Obie Dredge, PA-C [facility] Pager: [phone] Consults: [phone] Mon-Fri 7:00 am-4:30 pm Sat-Sun 7:00 am-11:30 am

## 2017-02-04 NOTE — Interval H&P Note (Signed)
History and Physical Interval Note:  02/04/2017 1:18 PM  Samuel Clarke  has presented today for surgery, with the diagnosis of gastric mass  The various methods of treatment have been discussed with the patient and family. After consideration of risks, benefits and other options for treatment, the patient has consented to  Procedure(s): ESOPHAGOGASTRODUODENOSCOPY (EGD) (N/A) as a surgical intervention .  The patient's history has been reviewed, patient examined, no change in status, stable for surgery.  I have reviewed the patient's chart and labs.  Questions were answered to the patient's satisfaction.     Pricilla Riffle. Fuller Plan

## 2017-02-04 NOTE — Anesthesia Preprocedure Evaluation (Addendum)
Anesthesia Evaluation  Patient identified by MRN, date of birth, ID band Patient awake    Reviewed: Allergy & Precautions, NPO status , Patient's Chart, lab work & pertinent test results  Airway Mallampati: I       Dental  (+) Teeth Intact, Chipped   Pulmonary    Pulmonary exam normal breath sounds clear to auscultation       Cardiovascular Normal cardiovascular exam Rhythm:Regular Rate:Normal     Neuro/Psych    GI/Hepatic   Endo/Other    Renal/GU      Musculoskeletal   Abdominal   Peds  Hematology  (+) JEHOVAH'S WITNESS  Anesthesia Other Findings   Reproductive/Obstetrics                            Anesthesia Physical Anesthesia Plan  ASA: III  Anesthesia Plan: MAC   Post-op Pain Management:    Induction: Intravenous  PONV Risk Score and Plan:   Airway Management Planned: Nasal Cannula  Additional Equipment:   Intra-op Plan:   Post-operative Plan:   Informed Consent: I have reviewed the patients History and Physical, chart, labs and discussed the procedure including the risks, benefits and alternatives for the proposed anesthesia with the patient or authorized representative who has indicated his/her understanding and acceptance.   Dental advisory given  Plan Discussed with: CRNA and Anesthesiologist  Anesthesia Plan Comments:         Anesthesia Quick Evaluation

## 2017-02-04 NOTE — Progress Notes (Signed)
   02/04/17 1230  Clinical Encounter Type  Visited With Patient and family together  Visit Type Initial  Spiritual Encounters  Spiritual Needs Literature  Stress Factors  Patient Stress Factors None identified  Family Stress Factors None identified  Introduction to Pt and family. AD notarized. Original to Pt and copy to chart.

## 2017-02-04 NOTE — ED Provider Notes (Signed)
[facility] DEPT Provider Note   CSN: [id number] Arrival date & time: 02/04/17  0219     History   Chief Complaint Chief Complaint  Patient presents with  . Chest Pain  . Shortness of Breath    HPI Samuel Clarke is a 56 y.o. male.  The history is provided by the patient.  He is a very vague historian. Apparently, he had a vague chest pressure feeling throughout the day. There is no associated dyspnea, nausea, diaphoresis. Nothing made it better, nothing made it worse. He went to dinner and had some pasta. After that, he states it felt like there was a lump of concrete in his left upper abdomen. There is associated nausea and he vomited a small amount without any relief. He currently rates that pain at 8/10. There is no radiation of pain. Nothing makes it better, nothing makes it worse. He did have some pain in his lower back, which did feel better file on a hard surface. This did not seem to be radiation of his abdominal pain. He still feels like there is a lump of concrete in his left upper abdomen. He is a nonsmoker and denies history of hypertension, diabetes, hyperlipidemia. There is a family history of premature coronary atherosclerosis (his mother had first MI in her mid 79s).  No past medical history on file.  There are no active problems to display for this patient.   No past surgical history on file.     Home Medications    Prior to Admission medications   Not on File    Family History No family history on file.  Social History Social History  Substance Use Topics  . Smoking status: Not on file  . Smokeless tobacco: Not on file  . Alcohol use Not on file     Allergies   Patient has no known allergies.   Review of Systems Review of Systems  All other systems reviewed and are negative.    Physical Exam Updated Vital Signs BP (!) 143/67 (BP Location: Left Arm)   Pulse 74   Temp 97.9 F (36.6 C) (Oral)   Resp 17   SpO2 100%   Physical  Exam  Nursing note and vitals reviewed.  56 year old male, resting comfortably and in no acute distress. Vital signs are significant for borderline hypertension. Oxygen saturation is 100%, which is normal. Head is normocephalic and atraumatic. PERRLA, EOMI. Oropharynx is clear. Neck is nontender and supple without adenopathy or JVD. Back is nontender and there is no CVA tenderness. Lungs are clear without rales, wheezes, or rhonchi. Chest is nontender. Heart has regular rate and rhythm without murmur. Abdomen is soft, flat, with mild to moderate left upper quadrant tenderness. There is no rebound or guarding. There are no masses or hepatosplenomegaly and peristalsis is hypoactive. Rectal: Normal sphincter tone. Small external hemorrhoids present. No stool in the rectal vault. Extremities have no cyanosis or edema, full range of motion is present. Skin is warm and dry without rash. Neurologic: Mental status is normal, cranial nerves are intact, there are no motor or sensory deficits. Psychiatric: Flat affect. Speaks in a low volume monotone. Makes poor eye contact.  ED Treatments / Results  Labs (all labs ordered are listed, but only abnormal results are displayed) Labs Reviewed  BASIC METABOLIC PANEL - Abnormal; Notable for the following:       Result Value   Sodium 125 (*)    Chloride 92 (*)    Glucose, Bld 156 (*)  Calcium 8.2 (*)    All other components within normal limits  CBC - Abnormal; Notable for the following:    WBC 15.1 (*)    RBC 3.43 (*)    Hemoglobin 9.5 (*)    HCT 28.8 (*)    All other components within normal limits  HEPATIC FUNCTION PANEL - Abnormal; Notable for the following:    ALT 16 (*)    All other components within normal limits  OSMOLALITY - Abnormal; Notable for the following:    Osmolality 267 (*)    All other components within normal limits  DIFFERENTIAL - Abnormal; Notable for the following:    Neutro Abs 12.5 (*)    Monocytes Absolute 1.1 (*)     All other components within normal limits  GLUCOSE, CAPILLARY - Abnormal; Notable for the following:    Glucose-Capillary 129 (*)    All other components within normal limits  CULTURE, BLOOD (ROUTINE X 2)  CULTURE, BLOOD (ROUTINE X 2)  LIPASE, BLOOD  OSMOLALITY, URINE  SODIUM, URINE, RANDOM  PROTIME-INR  APTT  HIV ANTIBODY (ROUTINE TESTING)  CA 125  CEA  TSH  BASIC METABOLIC PANEL  CBC WITH DIFFERENTIAL/PLATELET  I-STAT TROPONIN, ED  POC OCCULT BLOOD, ED  TYPE AND SCREEN  ABO/RH  SURGICAL PATHOLOGY    EKG  EKG Interpretation  Date/Time:  Monday February 04 2017 02:25:31 EDT Ventricular Rate:  77 PR Interval:  130 QRS Duration: 80 QT Interval:  418 QTC Calculation: 473 R Axis:   66 Text Interpretation:  Normal sinus rhythm Nonspecific ST abnormality Abnormal ECG No old tracing to compare Confirmed by Delora Fuel ([id number]) on 02/04/2017 2:40:57 AM       Radiology Ct Abdomen Pelvis W Contrast  Result Date: 02/04/2017 CLINICAL DATA:  Acute onset of vomiting and generalized chest pain. Diaphoresis and near syncope. Initial encounter. EXAM: CT ABDOMEN AND PELVIS WITH CONTRAST TECHNIQUE: Multidetector CT imaging of the abdomen and pelvis was performed using the standard protocol following bolus administration of intravenous contrast. CONTRAST:  146mL ISOVUE-300 IOPAMIDOL (ISOVUE-300) INJECTION 61% COMPARISON:  None. FINDINGS: Lower chest: The visualized lung bases are clear. Scattered coronary artery calcifications are seen. Hepatobiliary: The liver is unremarkable in appearance. The gallbladder is unremarkable in appearance. The common bile duct remains normal in caliber. Mild complex ascites is seen tracking about the liver. Pancreas: The pancreas is grossly unremarkable in appearance. Diffuse soft tissue edema is noted tracking about the pancreas, though it appears separate from the pancreas. Spleen: Complex fluid is noted tracking about the spleen. The spleen is grossly  unremarkable. Adrenals/Urinary Tract: The adrenal glands are grossly unremarkable in appearance, though the left adrenal gland is not well assessed due to the adjacent mass described below. The kidneys are unremarkable in appearance. There is no evidence of hydronephrosis. No renal or ureteral stones are identified. No perinephric stranding is seen. Stomach/Bowel: There is a large 7.6 x 7.3 x 6.3 cm mass noted at the posterior aspect of the gastric fundus. On sagittal images, this appears to arise within the gastric wall. This is suspicious for malignancy, with associated perforation, given complex ascites throughout the abdomen and pelvis. It is difficult to determine whether the ascites reflects primarily gastric contents or blood. There is no evidence of active contrast extravasation at this time. There appears to be diffuse gastric wall thickening, concerning for underlying gastritis. The small bowel is grossly unremarkable in appearance, aside from fluid tracking about small bowel loops. The appendix is normal in caliber, without  evidence of appendicitis. The colon is unremarkable in appearance. Soft tissue inflammation tracks about the splenic flexure of the colon, reflecting underlying mesenteric edema secondary to the perforation. Vascular/Lymphatic: Scattered calcification is seen along the abdominal aorta and its branches. The abdominal aorta is otherwise grossly unremarkable. The inferior vena cava is grossly unremarkable. No retroperitoneal lymphadenopathy is seen. No pelvic sidewall lymphadenopathy is identified. Reproductive: The bladder is mildly distended and grossly unremarkable. The prostate remains normal in size. Other: As described above, there is small to moderate volume complex relatively high attenuation ascites within the abdomen and pelvis. This could reflect gastric contents, though the lack of free intraperitoneal air is unusual. Alternatively, it could reflect hemorrhage from the gastric  mass. Musculoskeletal: No acute osseous abnormalities are identified. The visualized musculature is unremarkable in appearance. IMPRESSION: 1. Large 7.6 x 7.3 x 6.3 cm mass at the posterior aspect of the gastric fundus. On sagittal images, this appears to arise within the gastric wall. This is suspicious for malignancy, with associated perforation, given complex ascites throughout the abdomen and pelvis. 2. It is difficult to determine whether the small to moderate volume complex ascites reflects primarily gastric contents or blood. The lack of free intraperitoneal air suggests against gastric contents. Hemorrhage from the gastric mass could have such an appearance. 3. Diffuse soft tissue edema tracks about the mesentery of the upper abdomen, likely reflecting the perforation of the adjacent gastric mass. 4. Scattered coronary artery calcifications noted. 5. Scattered aortic atherosclerosis. Critical Value/emergent results were called by telephone at the time of interpretation on 02/04/2017 at 4:25 am to Dr. Delora Fuel, who verbally acknowledged these results. Electronically Signed   By: Garald Balding M.D.   On: 02/04/2017 04:37   Dg Chest Portable 1 View  Result Date: 02/04/2017 CLINICAL DATA:  Chest pain with shortness of breath EXAM: PORTABLE CHEST 1 VIEW COMPARISON:  None. FINDINGS: The heart size and mediastinal contours are within normal limits. Both lungs are clear. The visualized skeletal structures are unremarkable. IMPRESSION: No active disease. Electronically Signed   By: Donavan Foil M.D.   On: 02/04/2017 02:48    Procedures Procedures (including critical care time)  Medications Ordered in ED Medications  ondansetron (ZOFRAN) injection 4 mg ( Intravenous MAR Unhold 02/04/17 1429)  morphine 4 MG/ML injection 2 mg ( Intravenous MAR Unhold 02/04/17 1429)  0.9 %  sodium chloride infusion ( Intravenous Anesthesia Volume Adjustment 02/04/17 1355)  LORazepam (ATIVAN) tablet 1 mg ( Oral MAR Unhold  02/04/17 1429)    Or  LORazepam (ATIVAN) injection 1 mg ( Intravenous MAR Unhold 02/04/17 1429)  thiamine (VITAMIN B-1) tablet 100 mg ( Oral MAR Unhold 02/04/17 1429)    Or  thiamine (B-1) injection 100 mg ( Intravenous MAR Unhold 02/04/17 1429)  LORazepam (ATIVAN) injection 0-4 mg (0 mg Intravenous Not Given 02/04/17 1807)    Followed by  LORazepam (ATIVAN) injection 0-4 mg ( Intravenous MAR Unhold 02/04/17 1429)  piperacillin-tazobactam (ZOSYN) IVPB 3.375 g (3.375 g Intravenous New Bag/Given 02/04/17 1808)  acetaminophen (TYLENOL) suppository 650 mg ( Rectal MAR Unhold 6/60/60 0459)  folic acid injection 1 mg ( Intravenous MAR Unhold 02/04/17 1429)  ondansetron (ZOFRAN) injection 4 mg (4 mg Intravenous Given 02/04/17 0337)  aspirin chewable tablet 324 mg (324 mg Oral Given 02/04/17 0333)  iopamidol (ISOVUE-300) 61 % injection (100 mLs  Contrast Given 02/04/17 0345)  morphine 4 MG/ML injection 4 mg (4 mg Intravenous Given 02/04/17 0337)  piperacillin-tazobactam (ZOSYN) IVPB 3.375 g (0 g Intravenous  Stopped 02/04/17 0507)     Initial Impression / Assessment and Plan / ED Course  I have reviewed the triage vital signs and the nursing notes.  Pertinent labs & imaging results that were available during my care of the patient were reviewed by me and considered in my medical decision making (see chart for details).  Chest discomfort and abdominal discomfort of uncertain cause. Laboratory workup had been ordered and was complete when I saw the patient. ECG shows no acute changes. Chest x-ray is unremarkable. Laboratory workup is significant for sodium 125, glucose 156, hemoglobin 9.5 with normal RBC indices, WBC elevated at 15.1. He has no prior records in the [facility], but labs were available through [facility]. Hemoglobin on 07/20/2016 was 13.5, sodium 139. He also had 2 negative stool occult blood tests. He will be sent for CT of abdomen and pelvis.  Ct shows a gastric mass with evidence of  perforation. He is started on Zosyn. Case is discussed with Dr. Grandville Silos of general surgery, who saw the patient and asked that he be admitted to the medicine service. Case is discussed with Dr. Blaine Hamper, of [facility], who agrees to admit the patient. Of note, patient is a Acupuncturist Witness, and does not want any blood transfusions.  Final Clinical Impressions(s) / ED Diagnoses   Final diagnoses:  Gastric mass  Normochromic normocytic anemia  Hyponatremia  Perforated viscus    New Prescriptions New Prescriptions   No medications on file     Delora Fuel, MD 62/26/33 2300

## 2017-02-04 NOTE — Consult Note (Signed)
Reason for Consult:gastric mass Referring Physician: [name] is an 56 y.o. male.  HPI: Samuel Clarke is a very pleasant 56 year old [place] who went to church yesterday. He went to [employer] afterwords and ate some fettuccine Alfredo. He developed a sensation of significant fullness in his left upper quadrant and epigastrium region. There is associated with some shortness of breath which resolved. He has been eating fairly normally up until then. 2 normal bowel movements today without melena. Interestingly, he works as a Diplomatic Services operational officer. He likes to eat weeds from time to time. This past Friday, 810/18, he harvested some kudzu from a yard where he was working. When he went home he prepared boiled kudzu and fried kudzu. He ate a moderate amount of both. He came to the emergency department today for further evaluation and CT scan the abdomen and pelvis reveals a large gastric mass without perforation but with associated hemoperitoneum. He is anemic with hemoglobin 9.5.  Past Medical History:  Diagnosis Date  . Alcohol use   . Patient is Jehovah's Witness     No past surgical history on file.  No family history on file.  Social History:  has no tobacco, alcohol, and drug history on file.  Allergies: No Known Allergies  Medications: Prior to Admission:  (Not in a hospital admission)  Results for orders placed or performed during the hospital encounter of 02/04/17 (from the past 48 hour(s))  Basic metabolic panel     Status: Abnormal   Collection Time: 02/04/17  2:31 AM  Result Value Ref Range   Sodium 125 (L) 135 - 145 mmol/L   Potassium 3.6 3.5 - 5.1 mmol/L   Chloride 92 (L) 101 - 111 mmol/L   CO2 22 22 - 32 mmol/L   Glucose, Bld 156 (H) 65 - 99 mg/dL   BUN 8 6 - 20 mg/dL   Creatinine, Ser 0.74 0.61 - 1.24 mg/dL   Calcium 8.2 (L) 8.9 - 10.3 mg/dL   GFR calc non Af Amer >60 >60 mL/min   GFR calc Af Amer >60 >60 mL/min    Comment: (NOTE) The eGFR has been  calculated using the CKD EPI equation. This calculation has not been validated in all clinical situations. eGFR's persistently <60 mL/min signify possible Chronic Kidney Disease.    Anion gap 11 5 - 15  CBC     Status: Abnormal   Collection Time: 02/04/17  2:31 AM  Result Value Ref Range   WBC 15.1 (H) 4.0 - 10.5 K/uL   RBC 3.43 (L) 4.22 - 5.81 MIL/uL   Hemoglobin 9.5 (L) 13.0 - 17.0 g/dL   HCT 28.8 (L) 39.0 - 52.0 %   MCV 84.0 78.0 - 100.0 fL   MCH 27.7 26.0 - 34.0 pg   MCHC 33.0 30.0 - 36.0 g/dL   RDW 14.5 11.5 - 15.5 %   Platelets 181 150 - 400 K/uL  I-stat troponin, ED     Status: None   Collection Time: 02/04/17  2:44 AM  Result Value Ref Range   Troponin i, poc 0.00 0.00 - 0.08 ng/mL   Comment 3            Comment: Due to the release kinetics of cTnI, a negative result within the first hours of the onset of symptoms does not rule out myocardial infarction with certainty. If myocardial infarction is still suspected, repeat the test at appropriate intervals.   POC occult blood, ED Provider will collect  Status: None   Collection Time: 02/04/17  3:35 AM  Result Value Ref Range   Fecal Occult Bld NEGATIVE NEGATIVE    Ct Abdomen Pelvis W Contrast  Result Date: 02/04/2017 CLINICAL DATA:  Acute onset of vomiting and generalized chest pain. Diaphoresis and near syncope. Initial encounter. EXAM: CT ABDOMEN AND PELVIS WITH CONTRAST TECHNIQUE: Multidetector CT imaging of the abdomen and pelvis was performed using the standard protocol following bolus administration of intravenous contrast. CONTRAST:  186m ISOVUE-300 IOPAMIDOL (ISOVUE-300) INJECTION 61% COMPARISON:  None. FINDINGS: Lower chest: The visualized lung bases are clear. Scattered coronary artery calcifications are seen. Hepatobiliary: The liver is unremarkable in appearance. The gallbladder is unremarkable in appearance. The common bile duct remains normal in caliber. Mild complex ascites is seen tracking about the  liver. Pancreas: The pancreas is grossly unremarkable in appearance. Diffuse soft tissue edema is noted tracking about the pancreas, though it appears separate from the pancreas. Spleen: Complex fluid is noted tracking about the spleen. The spleen is grossly unremarkable. Adrenals/Urinary Tract: The adrenal glands are grossly unremarkable in appearance, though the left adrenal gland is not well assessed due to the adjacent mass described below. The kidneys are unremarkable in appearance. There is no evidence of hydronephrosis. No renal or ureteral stones are identified. No perinephric stranding is seen. Stomach/Bowel: There is a large 7.6 x 7.3 x 6.3 cm mass noted at the posterior aspect of the gastric fundus. On sagittal images, this appears to arise within the gastric wall. This is suspicious for malignancy, with associated perforation, given complex ascites throughout the abdomen and pelvis. It is difficult to determine whether the ascites reflects primarily gastric contents or blood. There is no evidence of active contrast extravasation at this time. There appears to be diffuse gastric wall thickening, concerning for underlying gastritis. The small bowel is grossly unremarkable in appearance, aside from fluid tracking about small bowel loops. The appendix is normal in caliber, without evidence of appendicitis. The colon is unremarkable in appearance. Soft tissue inflammation tracks about the splenic flexure of the colon, reflecting underlying mesenteric edema secondary to the perforation. Vascular/Lymphatic: Scattered calcification is seen along the abdominal aorta and its branches. The abdominal aorta is otherwise grossly unremarkable. The inferior vena cava is grossly unremarkable. No retroperitoneal lymphadenopathy is seen. No pelvic sidewall lymphadenopathy is identified. Reproductive: The bladder is mildly distended and grossly unremarkable. The prostate remains normal in size. Other: As described above,  there is small to moderate volume complex relatively high attenuation ascites within the abdomen and pelvis. This could reflect gastric contents, though the lack of free intraperitoneal air is unusual. Alternatively, it could reflect hemorrhage from the gastric mass. Musculoskeletal: No acute osseous abnormalities are identified. The visualized musculature is unremarkable in appearance. IMPRESSION: 1. Large 7.6 x 7.3 x 6.3 cm mass at the posterior aspect of the gastric fundus. On sagittal images, this appears to arise within the gastric wall. This is suspicious for malignancy, with associated perforation, given complex ascites throughout the abdomen and pelvis. 2. It is difficult to determine whether the small to moderate volume complex ascites reflects primarily gastric contents or blood. The lack of free intraperitoneal air suggests against gastric contents. Hemorrhage from the gastric mass could have such an appearance. 3. Diffuse soft tissue edema tracks about the mesentery of the upper abdomen, likely reflecting the perforation of the adjacent gastric mass. 4. Scattered coronary artery calcifications noted. 5. Scattered aortic atherosclerosis. Critical Value/emergent results were called by telephone at the time of interpretation  on 02/04/2017 at 4:25 am to Dr. Delora Fuel, who verbally acknowledged these results. Electronically Signed   By: Garald Balding M.D.   On: 02/04/2017 04:37   Dg Chest Portable 1 View  Result Date: 02/04/2017 CLINICAL DATA:  Chest pain with shortness of breath EXAM: PORTABLE CHEST 1 VIEW COMPARISON:  None. FINDINGS: The heart size and mediastinal contours are within normal limits. Both lungs are clear. The visualized skeletal structures are unremarkable. IMPRESSION: No active disease. Electronically Signed   By: Donavan Foil M.D.   On: 02/04/2017 02:48    Review of Systems  Constitutional: Negative for chills and fever.  HENT: Negative for hearing loss.   Eyes: Negative for  blurred vision and double vision.  Respiratory: Positive for shortness of breath.   Cardiovascular: Negative for chest pain and leg swelling.  Gastrointestinal: Positive for abdominal pain and nausea. Negative for blood in stool, constipation, diarrhea, melena and vomiting.  Genitourinary: Negative.   Musculoskeletal: Negative.   Skin: Negative.   Neurological: Negative.   Endo/Heme/Allergies: Negative.   Psychiatric/Behavioral: Negative.    Blood pressure 125/77, pulse 73, temperature 97.9 F (36.6 C), temperature source Oral, resp. rate (!) 24, SpO2 100 %. Physical Exam  Constitutional: He is oriented to person, place, and time. He appears well-developed and well-nourished.  HENT:  Head: Normocephalic.  Right Ear: External ear normal.  Left Ear: External ear normal.  Mouth/Throat: Oropharynx is clear and moist.  Eyes: Pupils are equal, round, and reactive to light. EOM are normal.  Neck: Neck supple. No tracheal deviation present. No thyromegaly present.  Cardiovascular: Normal rate, regular rhythm, normal heart sounds and intact distal pulses.   Respiratory: Effort normal and breath sounds normal. No respiratory distress. He has no wheezes. He has no rales.  GI: Soft. He exhibits mass. He exhibits no distension. There is tenderness. There is no rebound and no guarding.  Mild left upper quadrant tenderness with vague fullness, no generalized tenderness, no peritonitis  Musculoskeletal: Normal range of motion. He exhibits no edema.  Neurological: He is alert and oriented to person, place, and time. He exhibits normal muscle tone.  Skin: Skin is warm.  Psychiatric: He has a normal mood and affect.    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 I confirmed with him that he does not want any blood product transfusions. He also would not like autotransfusion or any partial blood products. Agree with medical admission. I will discuss  his case further with my partners this morning including Dr. Georgette Dover as well as our surgical oncologist partner to make a coordinated plan. I discussed this at length with him and his wife. She is going home to get his medical papers and contact family members and other church members. We will discuss things further today.  Haleigh Desmith E 02/04/2017, 5:14 AM

## 2017-02-04 NOTE — ED Notes (Signed)
Patient transported to CT 

## 2017-02-04 NOTE — Anesthesia Preprocedure Evaluation (Signed)
Anesthesia Evaluation  Patient identified by MRN, date of birth, ID band Patient awake    Reviewed: Allergy & Precautions, NPO status , Patient's Chart, lab work & pertinent test results  Airway Mallampati: II  TM Distance: >3 FB Neck ROM: Full    Dental  (+) Dental Advisory Given   Pulmonary neg pulmonary ROS,    breath sounds clear to auscultation       Cardiovascular negative cardio ROS   Rhythm:Regular Rate:Normal     Neuro/Psych negative neurological ROS     GI/Hepatic Neg liver ROS, Gastric mass   Endo/Other  negative endocrine ROS  Renal/GU negative Renal ROS     Musculoskeletal negative musculoskeletal ROS (+)   Abdominal   Peds  Hematology  (+) anemia ,   Anesthesia Other Findings   Reproductive/Obstetrics                             Lab Results  Component Value Date   WBC 15.1 (H) 02/04/2017   HGB 9.5 (L) 02/04/2017   HCT 28.8 (L) 02/04/2017   MCV 84.0 02/04/2017   PLT 181 02/04/2017   Lab Results  Component Value Date   CREATININE 0.74 02/04/2017   BUN 8 02/04/2017   NA 125 (L) 02/04/2017   K 3.6 02/04/2017   CL 92 (L) 02/04/2017   CO2 22 02/04/2017    Anesthesia Physical Anesthesia Plan  ASA: II  Anesthesia Plan: MAC   Post-op Pain Management:    Induction: Intravenous  PONV Risk Score and Plan: 1 and Ondansetron, Propofol infusion and Treatment may vary due to age or medical condition  Airway Management Planned: Natural Airway and Nasal Cannula  Additional Equipment:   Intra-op Plan:   Post-operative Plan:   Informed Consent: I have reviewed the patients History and Physical, chart, labs and discussed the procedure including the risks, benefits and alternatives for the proposed anesthesia with the patient or authorized representative who has indicated his/her understanding and acceptance.     Plan Discussed with: CRNA  Anesthesia Plan  Comments:         Anesthesia Quick Evaluation

## 2017-02-04 NOTE — H&P (Addendum)
History and Physical    Samuel Clarke NFA:[id number] DOB: 01/30/1961 DOA: 02/04/2017  Referring MD/NP/PA:   PCP: Caroline More, MD   Patient coming from:  The patient is coming from home.  At baseline, pt is independent for most of ADL.   Chief Complaint: Chest pain and abdominal pain  HPI: Samuel Clarke is a 56 y.o. male with medical history significant of Jehovah's witness, alcohol use, who presents with chest pain and abdominal pain.  Pt is a poor historian. Pt states that he has been having chest discomfort for the whole day. He went out to dinner and had some pasta. After that, he states it felt like there was a lump of concrete in his left upper abdomen. He has nausea, no vomiting or diarrhea. He has pain in the lower chest and upper abdomen currently. The pain is constant, 8 out of 10 in severity, nonradiating. It is not aggravated or alleviated by any known factors. He also has some pain in his lower back. Patient denies shortness breath, cough, fever or chills. No symptoms of UTI or unilateral weakness. No hematochezia, hematemesis or hematuria. Patient states that he drinks 3 beers, mostly every day. He does not smoke cigarettes.  ED Course: pt was found to have WBC 15.1, negative troponin, hemoglobin 9.5 which was 13.5 on 07/20/16, sodium 125, creatinine normal, negative FOBT, chest x-ray negative. Patient is admitted to telemetry bed as inpatient. Gen. surgeon, Dr. Janee Morn was consulted.  CT-abdomen/pelvis showed: 1. Large 7.6 x 7.3 x 6.3 cm mass at the posterior aspect of the gastric fundus. On sagittal images, this appears to arise within the  astric wall. This is suspicious for malignancy, with associated perforation, given complex ascites throughout the abdomen and pelvis. 2. It is difficult to determine whether the small to moderate volume complex ascites reflects primarily gastric contents or blood. The lack of free intraperitoneal air suggests against gastric  contents. Hemorrhage from the gastric mass could have such an appearance. 3. Diffuse soft tissue edema tracks about the mesentery of the upper abdomen, likely reflecting the perforation of the adjacent gastric mass. 4. Scattered coronary artery calcifications noted. 5. Scattered aortic atherosclerosis.  Review of Systems:   General: no fevers, chills, no body weight gain, has poor appetite, has fatigue HEENT: no blurry vision, hearing changes or sore throat Respiratory: no dyspnea, coughing, wheezing CV: has chest pain, no palpitations GI: has nausea, abdominal pain, no vomiting, diarrhea, constipation GU: no dysuria, burning on urination, increased urinary frequency, hematuria  Ext: no leg edema Neuro: no unilateral weakness, numbness, or tingling, no vision change or hearing loss Skin: no rash, no skin tear. MSK: No muscle spasm, no deformity, no limitation of range of movement in spin Heme: No easy bruising.  Travel history: No recent long distant travel.  Allergy: No Known Allergies  Past Medical History:  Diagnosis Date  . Alcohol use   . Patient is Jehovah's Witness     Past Surgical History:  Procedure Laterality Date  . TONSILLECTOMY      Social History:  reports that he has never smoked. He does not have any smokeless tobacco history on file. He reports that he drinks alcohol. He reports that he does not use drugs.  Family History:  Family History  Problem Relation Age of Onset  . Diabetes Mellitus II Mother   . Transient ischemic attack Father   . Diabetes Mellitus II Sister      Prior to Admission medications   Not on  File    Physical Exam: Vitals:   02/04/17 0330 02/04/17 0400 02/04/17 0430 02/04/17 0515  BP: 128/61 (!) 143/64 125/77 (!) 129/59  Pulse: 66  73 72  Resp: (!) 22  (!) 24 (!) 26  Temp:      TempSrc:      SpO2: 100%  100% 100%   General: Not in acute distress. Patient is anxious.  HEENT:       Eyes: PERRL, EOMI, no scleral icterus.        ENT: No discharge from the ears and nose, no pharynx injection, no tonsillar enlargement.        Neck: No JVD, no bruit, no mass felt. Heme: No neck lymph node enlargement. Cardiac: S1/S2, RRR, No murmurs, No gallops or rubs. Respiratory: No rales, wheezing, rhonchi or rubs. GI: Soft, nondistended, has tenderness in upper abdomen, no rebound pain, no organomegaly, BS present. GU: No hematuria Ext: No pitting leg edema bilaterally. 2+DP/PT pulse bilaterally. Musculoskeletal: No joint deformities, No joint redness or warmth, no limitation of ROM in spin. Skin: No rashes.  Neuro: Alert, oriented X3, cranial nerves II-XII grossly intact, moves all extremities normally.  Psych: Patient is not psychotic, no suicidal or hemocidal ideation.  Labs on Admission: I have personally reviewed following labs and imaging studies  CBC:  Recent Labs Lab 02/04/17 0231  WBC 15.1*  HGB 9.5*  HCT 28.8*  MCV 84.0  PLT 181   Basic Metabolic Panel:  Recent Labs Lab 02/04/17 0231  NA 125*  K 3.6  CL 92*  CO2 22  GLUCOSE 156*  BUN 8  CREATININE 0.74  CALCIUM 8.2*   GFR: CrCl cannot be calculated (Unknown ideal weight.). Liver Function Tests: No results for input(s): AST, ALT, ALKPHOS, BILITOT, PROT, ALBUMIN in the last 168 hours. No results for input(s): LIPASE, AMYLASE in the last 168 hours. No results for input(s): AMMONIA in the last 168 hours. Coagulation Profile: No results for input(s): INR, PROTIME in the last 168 hours. Cardiac Enzymes: No results for input(s): CKTOTAL, CKMB, CKMBINDEX, TROPONINI in the last 168 hours. BNP (last 3 results) No results for input(s): PROBNP in the last 8760 hours. HbA1C: No results for input(s): HGBA1C in the last 72 hours. CBG: No results for input(s): GLUCAP in the last 168 hours. Lipid Profile: No results for input(s): CHOL, HDL, LDLCALC, TRIG, CHOLHDL, LDLDIRECT in the last 72 hours. Thyroid Function Tests: No results for input(s): TSH,  T4TOTAL, FREET4, T3FREE, THYROIDAB in the last 72 hours. Anemia Panel: No results for input(s): VITAMINB12, FOLATE, FERRITIN, TIBC, IRON, RETICCTPCT in the last 72 hours. Urine analysis: No results found for: COLORURINE, APPEARANCEUR, LABSPEC, PHURINE, GLUCOSEU, HGBUR, BILIRUBINUR, KETONESUR, PROTEINUR, UROBILINOGEN, NITRITE, LEUKOCYTESUR Sepsis Labs: @LABRCNTIP (procalcitonin:4,lacticidven:4) )No results found for this or any previous visit (from the past 240 hour(s)).   Radiological Exams on Admission: Ct Abdomen Pelvis W Contrast  Result Date: 02/04/2017 CLINICAL DATA:  Acute onset of vomiting and generalized chest pain. Diaphoresis and near syncope. Initial encounter. EXAM: CT ABDOMEN AND PELVIS WITH CONTRAST TECHNIQUE: Multidetector CT imaging of the abdomen and pelvis was performed using the standard protocol following bolus administration of intravenous contrast. CONTRAST:  ISOVUE-300 IOPAMIDOL (ISOVUE-300) INJECTION 61% COMPARISON:  None. FINDINGS: Lower chest: The visualized lung bases are clear. Scattered coronary artery calcifications are seen. Hepatobiliary: The liver is unremarkable in appearance. The gallbladder is unremarkable in appearance. The common bile duct remains normal in caliber. Mild complex ascites is seen tracking about the liver. Pancreas: The pancreas  is grossly unremarkable in appearance. Diffuse soft tissue edema is noted tracking about the pancreas, though it appears separate from the pancreas. Spleen: Complex fluid is noted tracking about the spleen. The spleen is grossly unremarkable. Adrenals/Urinary Tract: The adrenal glands are grossly unremarkable in appearance, though the left adrenal gland is not well assessed due to the adjacent mass described below. The kidneys are unremarkable in appearance. There is no evidence of hydronephrosis. No renal or ureteral stones are identified. No perinephric stranding is seen. Stomach/Bowel: There is a large 7.6 x 7.3 x 6.3 cm  mass noted at the posterior aspect of the gastric fundus. On sagittal images, this appears to arise within the gastric wall. This is suspicious for malignancy, with associated perforation, given complex ascites throughout the abdomen and pelvis. It is difficult to determine whether the ascites reflects primarily gastric contents or blood. There is no evidence of active contrast extravasation at this time. There appears to be diffuse gastric wall thickening, concerning for underlying gastritis. The small bowel is grossly unremarkable in appearance, aside from fluid tracking about small bowel loops. The appendix is normal in caliber, without evidence of appendicitis. The colon is unremarkable in appearance. Soft tissue inflammation tracks about the splenic flexure of the colon, reflecting underlying mesenteric edema secondary to the perforation. Vascular/Lymphatic: Scattered calcification is seen along the abdominal aorta and its branches. The abdominal aorta is otherwise grossly unremarkable. The inferior vena cava is grossly unremarkable. No retroperitoneal lymphadenopathy is seen. No pelvic sidewall lymphadenopathy is identified. Reproductive: The bladder is mildly distended and grossly unremarkable. The prostate remains normal in size. Other: As described above, there is small to moderate volume complex relatively high attenuation ascites within the abdomen and pelvis. This could reflect gastric contents, though the lack of free intraperitoneal air is unusual. Alternatively, it could reflect hemorrhage from the gastric mass. Musculoskeletal: No acute osseous abnormalities are identified. The visualized musculature is unremarkable in appearance. IMPRESSION: 1. Large 7.6 x 7.3 x 6.3 cm mass at the posterior aspect of the gastric fundus. On sagittal images, this appears to arise within the gastric wall. This is suspicious for malignancy, with associated perforation, given complex ascites throughout the abdomen and  pelvis. 2. It is difficult to determine whether the small to moderate volume complex ascites reflects primarily gastric contents or blood. The lack of free intraperitoneal air suggests against gastric contents. Hemorrhage from the gastric mass could have such an appearance. 3. Diffuse soft tissue edema tracks about the mesentery of the upper abdomen, likely reflecting the perforation of the adjacent gastric mass. 4. Scattered coronary artery calcifications noted. 5. Scattered aortic atherosclerosis. Critical Value/emergent results were called by telephone at the time of interpretation on 02/04/2017 at 4:25 am to Dr. Dione Booze, who verbally acknowledged these results. Electronically Signed   By: Roanna Raider M.D.   On: 02/04/2017 04:37   Dg Chest Portable 1 View  Result Date: 02/04/2017 CLINICAL DATA:  Chest pain with shortness of breath EXAM: PORTABLE CHEST 1 VIEW COMPARISON:  None. FINDINGS: The heart size and mediastinal contours are within normal limits. Both lungs are clear. The visualized skeletal structures are unremarkable. IMPRESSION: No active disease. Electronically Signed   By: Jasmine Pang M.D.   On: 02/04/2017 02:48    EKG:  Not done in ED, will get one.   Assessment/Plan Principal Problem:   Gastric mass Active Problems:   Hyponatremia   Normocytic anemia   Leukocytosis   Alcohol use   Patient is Jehovah's Witness  Gastric mass with perforation: Patient's lower chest pain and upper abdominal pain is most likely due to perforated gastric or mass as shown by CT scan. This is likely due to malignancy. Patient has leukocytosis, but no fever. Currently does not seem to have sepsis. Hemodynamically stable. Gen. surgeon, Dr. Janee Morn was consulted.   -will admit to tele bed as inpt -f/u surgeon's recommendation -When necessary Zofran for nausea, morphine for pain -Nothing by mouth -IV fluids: 125 cc/h - IV zosyn was started in ED, will continue  -INR/PTT/type & screen - check  lipse, LFT, CEA and CA-125  Hyponatremia: Sodium 125. Mental status okay. Etiology is not clear. DD include potomania due to alcohol use, thyroid dysfunction and dehydration. - Will check urine sodium, urine osmolality, serum osmolality. - check TSH - IVF: normal saline at 125 mL/h - f/u by  BMP  Alcohol use: Drinks 3 beers every day. Patient is nervous, but does not seem to have withdraw, no tachycardia. -Did counseling about the importance of quitting drinking -CIWA protocol  Normocytic anemia: Hemoglobin dropped from 13.5 on 07/20/16-->9.5 today. May be due to intra-abdominal hemorrhage secondary to perforation of gastric mass. FOBT negative. -check anemia panel. -f/u by CBC  Patient is Jehovah's Witness: -should not transfuse blood.  DVT ppx: SCD Code Status: Full code Family Communication: Yes, patient's wife at bed side Disposition Plan:  Anticipate discharge back to previous home environment Consults called:  Gen. surgeon, Dr. Janee Morn Admission status: Inpatient/tele    Date of Service 02/04/2017    Lorretta Harp [facility] Pager [phone]  If 7PM-7AM, please contact night-coverage [email or web address] Password [facility] 02/04/2017, 5:21 AM

## 2017-02-05 ENCOUNTER — Inpatient Hospital Stay
Admission: EM | Admit: 2017-02-05 | Discharge: 2017-02-14 | Disposition: A | Discharge status: Home / Self Care | Payer: BC Managed Care – PPO | Source: Intra-hospital

## 2017-02-05 ENCOUNTER — Inpatient Hospital Stay
Admission: EM | Admit: 2017-02-05 | Discharge: 2017-02-14 | Disposition: A | Discharge status: Home / Self Care | Payer: BC Managed Care – PPO | Source: Intra-hospital | Attending: Certified Registered" | Admitting: Certified Registered"

## 2017-02-05 ENCOUNTER — Inpatient Hospital Stay
Admission: EM | Admit: 2017-02-05 | Discharge: 2017-02-14 | Disposition: A | Discharge status: Home / Self Care | Source: Intra-hospital | Attending: Registered Nurse

## 2017-02-05 ENCOUNTER — Inpatient Hospital Stay
Admission: EM | Admit: 2017-02-05 | Discharge: 2017-02-14 | Disposition: A | Discharge status: Home / Self Care | Source: Intra-hospital | Attending: Registered Nurse | Admitting: Registered Nurse

## 2017-02-05 ENCOUNTER — Inpatient Hospital Stay
Admission: EM | Admit: 2017-02-05 | Discharge: 2017-02-14 | Disposition: A | Discharge status: Home / Self Care | Source: Intra-hospital | Admitting: Surgery

## 2017-02-05 DIAGNOSIS — E871 Hypo-osmolality and hyponatremia: Secondary | ICD-10-CM

## 2017-02-05 DIAGNOSIS — R188 Other ascites: Secondary | ICD-10-CM | POA: Diagnosis not present

## 2017-02-05 DIAGNOSIS — R1010 Upper abdominal pain, unspecified: Secondary | ICD-10-CM | POA: Diagnosis not present

## 2017-02-05 DIAGNOSIS — Z789 Other specified health status: Secondary | ICD-10-CM | POA: Diagnosis not present

## 2017-02-05 DIAGNOSIS — R0789 Other chest pain: Secondary | ICD-10-CM | POA: Diagnosis not present

## 2017-02-05 DIAGNOSIS — K3189 Other diseases of stomach and duodenum: Secondary | ICD-10-CM | POA: Diagnosis not present

## 2017-02-05 DIAGNOSIS — R198 Other specified symptoms and signs involving the digestive system and abdomen: Secondary | ICD-10-CM | POA: Diagnosis not present

## 2017-02-05 DIAGNOSIS — K319 Disease of stomach and duodenum, unspecified: Secondary | ICD-10-CM | POA: Diagnosis not present

## 2017-02-05 DIAGNOSIS — R933 Abnormal findings on diagnostic imaging of other parts of digestive tract: Secondary | ICD-10-CM

## 2017-02-05 DIAGNOSIS — D649 Anemia, unspecified: Secondary | ICD-10-CM

## 2017-02-05 DIAGNOSIS — D72829 Elevated white blood cell count, unspecified: Secondary | ICD-10-CM | POA: Diagnosis not present

## 2017-02-05 LAB — TSH: TSH: 2.334 u[IU]/mL (ref 0.350–4.500)

## 2017-02-05 LAB — CBC WITH DIFFERENTIAL/PLATELET
BASOS ABS: 0 10*3/uL (ref 0.0–0.1)
Basophils Relative: 0 %
EOS ABS: 0.1 10*3/uL (ref 0.0–0.7)
Eosinophils Relative: 1 %
HCT: 25 % — ABNORMAL LOW (ref 39.0–52.0)
HEMOGLOBIN: 8.2 g/dL — AB (ref 13.0–17.0)
LYMPHS PCT: 14 %
Lymphs Abs: 1.3 10*3/uL (ref 0.7–4.0)
MCH: 27.8 pg (ref 26.0–34.0)
MCHC: 32.8 g/dL (ref 30.0–36.0)
MCV: 84.7 fL (ref 78.0–100.0)
Monocytes Absolute: 1.2 10*3/uL — ABNORMAL HIGH (ref 0.1–1.0)
Monocytes Relative: 12 %
NEUTROS ABS: 7 10*3/uL (ref 1.7–7.7)
Neutrophils Relative %: 73 %
PLATELETS: 155 10*3/uL (ref 150–400)
RBC: 2.95 MIL/uL — AB (ref 4.22–5.81)
RDW: 14.9 % (ref 11.5–15.5)
WBC: 9.5 10*3/uL (ref 4.0–10.5)

## 2017-02-05 LAB — BASIC METABOLIC PANEL
ANION GAP: 7 (ref 5–15)
CHLORIDE: 104 mmol/L (ref 101–111)
CO2: 27 mmol/L (ref 22–32)
Calcium: 8.3 mg/dL — ABNORMAL LOW (ref 8.9–10.3)
Creatinine, Ser: 0.8 mg/dL (ref 0.61–1.24)
GFR calc Af Amer: >60 mL/min (ref >60)
GFR calc non Af Amer: >60 mL/min (ref >60)
GLUCOSE: 134 mg/dL — AB (ref 65–99)
POTASSIUM: 3.7 mmol/L (ref 3.5–5.1)
Sodium: 138 mmol/L (ref 135–145)

## 2017-02-05 LAB — GLUCOSE, CAPILLARY: GLUCOSE-CAPILLARY: 146 mg/dL — AB (ref 65–99)

## 2017-02-05 LAB — CEA: CEA: 1.3 ng/mL (ref 0.0–4.7)

## 2017-02-05 LAB — CA 125: Cancer Antigen (CA) 125: 5.2 U/mL

## 2017-02-05 MED ORDER — TRAMADOL HCL 50 MG PO TABS: 50.0000 mg | ORAL_TABLET | Freq: Four times a day (QID) | ORAL | Status: DC | PRN

## 2017-02-05 MED ORDER — ACETAMINOPHEN 325 MG PO TABS
650.0000 mg | ORAL_TABLET | Freq: Four times a day (QID) | ORAL | Status: DC | PRN
  Administered 2017-02-05: 650 mg via ORAL

## 2017-02-05 MED ORDER — FOLIC ACID 1 MG PO TABS
1.0000 mg | ORAL_TABLET | Freq: Every day | ORAL | Status: DC
  Administered 2017-02-05: 1 mg via ORAL
  Filled 2017-02-05: qty 1

## 2017-02-05 NOTE — Progress Notes (Signed)
Daily Rounding Note  02/05/2017, 9:39 AM  LOS: 1 day   SUBJECTIVE:   Chief complaint:  Pain is now in left shoulder, back, chest.  No N/V.  No abd pain   Tolerating clears   OBJECTIVE:         Vital signs in last 24 hours:    Temp:  [98.7 F (37.1 C)-100.2 F (37.9 C)] 99.3 F (37.4 C) (08/14 0602) Pulse Rate:  [82-97] 88 (08/14 0602) Resp:  [16-23] 17 (08/14 0602) BP: (116-155)/(54-68) 116/56 (08/14 0602) SpO2:  [98 %-100 %] 98 % (08/14 0602) Weight:  [98 kg (216 lb)] 98 kg (216 lb) (08/13 1300) Last BM Date: 02/03/17 Filed Weights   02/04/17 1300  Weight: 98 kg (216 lb)   General: looks well   Heart: RRR Chest: clear bil.   Abdomen: soft, ND, NT.  Active BS  Extremities: no CCE Neuro/Psych:  Alert, oriented x 3.  No weakness or tremor  Intake/Output from previous day: 08/13 0701 - 08/14 0700 In: 1281.3 [I.V.:1131.3; IV Piggyback:150] Out: 4580 [Urine:2550]  Intake/Output this shift: No intake/output data recorded.  Lab Results:  Recent Labs  02/04/17 0231 02/05/17 0336  WBC 15.1* 9.5  HGB 9.5* 8.2*  HCT 28.8* 25.0*  PLT 181 155   BMET  Recent Labs  02/04/17 0231 02/05/17 0336  NA 125* 138  K 3.6 3.7  CL 92* 104  CO2 22 27  GLUCOSE 156* 134*  BUN 8 <5*  CREATININE 0.74 0.80  CALCIUM 8.2* 8.3*   LFT  Recent Labs  02/04/17 0213  PROT 6.6  ALBUMIN 3.8  AST 29  ALT 16*  ALKPHOS 38  BILITOT 0.8  BILIDIR 0.1  IBILI 0.7   PT/INR  Recent Labs  02/04/17 0639  LABPROT 13.9  INR 1.06   Hepatitis Panel No results for input(s): HEPBSAG, HCVAB, HEPAIGM, HEPBIGM in the last 72 hours.  Studies/Results: Ct Abdomen Pelvis W Contrast  Result Date: 02/04/2017 CLINICAL DATA:  Acute onset of vomiting and generalized chest pain. Diaphoresis and near syncope. Initial encounter. EXAM: CT ABDOMEN AND PELVIS WITH CONTRAST TECHNIQUE: Multidetector CT imaging of the abdomen and pelvis was  performed using the standard protocol following bolus administration of intravenous contrast. CONTRAST:  139mL ISOVUE-300 IOPAMIDOL (ISOVUE-300) INJECTION 61% COMPARISON:  None. FINDINGS: Lower chest: The visualized lung bases are clear. Scattered coronary artery calcifications are seen. Hepatobiliary: The liver is unremarkable in appearance. The gallbladder is unremarkable in appearance. The common bile duct remains normal in caliber. Mild complex ascites is seen tracking about the liver. Pancreas: The pancreas is grossly unremarkable in appearance. Diffuse soft tissue edema is noted tracking about the pancreas, though it appears separate from the pancreas. Spleen: Complex fluid is noted tracking about the spleen. The spleen is grossly unremarkable. Adrenals/Urinary Tract: The adrenal glands are grossly unremarkable in appearance, though the left adrenal gland is not well assessed due to the adjacent mass described below. The kidneys are unremarkable in appearance. There is no evidence of hydronephrosis. No renal or ureteral stones are identified. No perinephric stranding is seen. Stomach/Bowel: There is a large 7.6 x 7.3 x 6.3 cm mass noted at the posterior aspect of the gastric fundus. On sagittal images, this appears to arise within the gastric wall. This is suspicious for malignancy, with associated perforation, given complex ascites throughout the abdomen and pelvis. It is difficult to determine whether the ascites reflects primarily gastric contents or blood. There is no  evidence of active contrast extravasation at this time. There appears to be diffuse gastric wall thickening, concerning for underlying gastritis. The small bowel is grossly unremarkable in appearance, aside from fluid tracking about small bowel loops. The appendix is normal in caliber, without evidence of appendicitis. The colon is unremarkable in appearance. Soft tissue inflammation tracks about the splenic flexure of the colon, reflecting  underlying mesenteric edema secondary to the perforation. Vascular/Lymphatic: Scattered calcification is seen along the abdominal aorta and its branches. The abdominal aorta is otherwise grossly unremarkable. The inferior vena cava is grossly unremarkable. No retroperitoneal lymphadenopathy is seen. No pelvic sidewall lymphadenopathy is identified. Reproductive: The bladder is mildly distended and grossly unremarkable. The prostate remains normal in size. Other: As described above, there is small to moderate volume complex relatively high attenuation ascites within the abdomen and pelvis. This could reflect gastric contents, though the lack of free intraperitoneal air is unusual. Alternatively, it could reflect hemorrhage from the gastric mass. Musculoskeletal: No acute osseous abnormalities are identified. The visualized musculature is unremarkable in appearance. IMPRESSION: 1. Large 7.6 x 7.3 x 6.3 cm mass at the posterior aspect of the gastric fundus. On sagittal images, this appears to arise within the gastric wall. This is suspicious for malignancy, with associated perforation, given complex ascites throughout the abdomen and pelvis. 2. It is difficult to determine whether the small to moderate volume complex ascites reflects primarily gastric contents or blood. The lack of free intraperitoneal air suggests against gastric contents. Hemorrhage from the gastric mass could have such an appearance. 3. Diffuse soft tissue edema tracks about the mesentery of the upper abdomen, likely reflecting the perforation of the adjacent gastric mass. 4. Scattered coronary artery calcifications noted. 5. Scattered aortic atherosclerosis. Critical Value/emergent results were called by telephone at the time of interpretation on 02/04/2017 at 4:25 am to Dr. Delora Fuel, who verbally acknowledged these results. Electronically Signed   By: Garald Balding M.D.   On: 02/04/2017 04:37   Dg Chest Portable 1 View  Result Date:  02/04/2017 CLINICAL DATA:  Chest pain with shortness of breath EXAM: PORTABLE CHEST 1 VIEW COMPARISON:  None. FINDINGS: The heart size and mediastinal contours are within normal limits. Both lungs are clear. The visualized skeletal structures are unremarkable. IMPRESSION: No active disease. Electronically Signed   By: Donavan Foil M.D.   On: 02/04/2017 02:48    ASSESMENT:   *  Gastric mass.   8/13 EGD: submucosal fundal mass, overlying area biopsied.  Path pending.  Gastric erythema.     CEA and CA 125 are not elevated.   *  Normocytic anemia.    PLAN   *  Dr Ardis Hughs to review case in regards to possible EUS and FNA.  Awaiting his input.    Awaiting path from endoscopic biopsy but odds are this will not be diagnostic.   Advance diet.  Encouraged ambulation.    Azucena Freed  02/05/2017, 9:39 AM Pager: [phone]     Attending physician's note   I have taken an interval history, reviewed the chart and examined the patient. I agree with the Advanced Practitioner's note, impression and recommendations. Abdominal pain improved. Hb decreased to 8.2 today - equilibration vs. persistent bleeding. Case discussed with Dr. Georgette Dover. Dr. Ardis Hughs reviewed for possible EUS/FNA today which could be done next week if the patient is stable to wait. We feel that due to significant bleeding surgical mgmt without EUS/FNA is very reasonable and could be necessary if bleeding persists. Pt is  a Jehavoh's Witness and he declines blood products.    Lucio Edward, MD [facility] [phone] Mon-Fri 8a-5p [phone] after 5p, weekends, holidays

## 2017-02-05 NOTE — Discharge Summary (Signed)
Physician Discharge Summary  Samuel Clarke PPI:[id number] DOB: 1961-06-03 DOA: 02/04/2017  PCP: Samuel More, MD  Admit date: 02/04/2017 Discharge date: 02/05/2017  Time spent: 45 minutes  Recommendations for Outpatient Follow-up:  Patient will be transferred to Samuel Clarke, under the care of Samuel Clarke.    Discharge Diagnoses:  Abdominal pain/ Gastric mass with perforation Hyponatremia Alcohol use Normocytic anemia  Discharge Condition: Stable  Diet recommendation: Clear liquid  Filed Weights   02/04/17 1300  Weight: 98 kg (216 lb)    History of present illness:  on 02/04/2017 by Samuel Clarke a 56 y.o.malewith medical history significant of Jehovah's witness,alcohol use, whopresents with chest pain and abdominal pain. Pt is a poor historian. Pt states that he has been having chest discomfort for the whole day.He went outto dinner and had some pasta. After that, he states it felt like there was a lump of concrete in his left upper abdomen. He has nausea, no vomiting or diarrhea. He has pain in the lower chest and upper abdomen currently. The pain is constant, 8 out of 10 in severity, nonradiating. It is not aggravated or alleviated by any known factors. He also has some pain in his lower back. Patient denies shortness breath, cough, fever or chills. No symptoms of UTI or unilateral weakness. No hematochezia, hematemesis or hematuria. Patient states that he drinks 3 beers,mostly every day. He does not smokecigarettes.  Hospital Course:  Abdominal pain/ Gastric mass with perforation -Presented with upper abdominal pain -CT abdomen and pelvis showed a large 7.6 x 7.3 x 6.3 cm mass posterior aspect of the gastric fundus suspicious for malignancy with associated perforation, given convex ascites throughout the abdomen and pelvis -General surgery and gastroenterology consulted and appreciated, pending further recommendations  -s/p EGD: Normal esophagus,  submucosal gastric tumor in the gastric fundus. Mucosa overlying the mass was biopsied. Erythematous mucosa in the gastric fundus and body. Normal duodenal bulb and second portion of duodenum.  -Continue antiemetics and pain control as needed -CA 125 5.2, CEA 1.3 -gastric biopsy showed benign gastric mucosa with edema and vascular congestion  -Discussed with Samuel Clarke, GI. Biopsy results not accurate will need EUS/FNA.  -After discussion, I feel patient is not stable for discharge and concerned about discharging him home with outpatient follow up given he is at high risk for bleeding and rapid decompensation. Patient needs FNA/EUS, however, this cannot be done until next week sometime.  -Discussed with Samuel Clarke, because mass is located near GE junction, would be a difficult/high risk surgery. Feels EUS should be done as this could be a tumor that could be treated with chemo. -Have called [facility], spoke with Samuel Clarke (GI) who accepted the patient but felt patient should be on a surg onc service. -Discussed with Samuel Clarke (surg onc at [facility]), who has graciously accepted the patient.   Hyponatremia -Sodium 125 on admission -Resolved, currently 138 -Currently with no neurological deficits -Possibly due to alcohol use -TSH 2.334  Alcohol use -Patient does appear to have mild tremor of his hands -Drinks 3 beers per evening -was placed on CIWA protocol  Normocytic anemia -Patient is a Jehovah's Witness and has refused all blood products including autotransfusion or partial blood products -Hemoglobin currently 8.2 -Hemoglobin in Jan 2018 was 13.5 -FOBT negative  Consultants General surgery Gastroenterology   Procedures  EGD   Discharge Exam: Vitals:   02/05/17 0602 02/05/17 1437  BP: (!) 116/56 137/61  Pulse: 88 (!) 101  Resp: 17 20  Temp: 99.3 F (37.4 C) 100.2 F (37.9 C)  SpO2: 98% 97%   Patient denies abdominal pain, nausea, vomiting, chest pain, shortness of  breath, dizziness, headache.    General: Well developed, well nourished, NAD, appears stated age  HEENT: NCAT, mucous membranes moist.  Cardiovascular: S1 S2 auscultated, no rubs, murmurs or gallops. Regular rate and rhythm.  Respiratory: Clear to auscultation bilaterally with equal chest rise  Abdomen: Soft, nontender, nondistended, + bowel sounds  Extremities: warm dry without cyanosis clubbing or edema  Neuro: AAOx3, nonfocal  Psych: Normal affect and demeanor with intact judgement and insight  Discharge Instructions  There are no discharge medications for this patient.  No Known Allergies    The results of significant diagnostics from this hospitalization (including imaging, microbiology, ancillary and laboratory) are listed below for reference.    Significant Diagnostic Studies: Ct Abdomen Pelvis W Contrast  Result Date: 02/04/2017 CLINICAL DATA:  Acute onset of vomiting and generalized chest pain. Diaphoresis and near syncope. Initial encounter. EXAM: CT ABDOMEN AND PELVIS WITH CONTRAST TECHNIQUE: Multidetector CT imaging of the abdomen and pelvis was performed using the standard protocol following bolus administration of intravenous contrast. CONTRAST:  ISOVUE-300 IOPAMIDOL (ISOVUE-300) INJECTION 61% COMPARISON:  None. FINDINGS: Lower chest: The visualized lung bases are clear. Scattered coronary artery calcifications are seen. Hepatobiliary: The liver is unremarkable in appearance. The gallbladder is unremarkable in appearance. The common bile duct remains normal in caliber. Mild complex ascites is seen tracking about the liver. Pancreas: The pancreas is grossly unremarkable in appearance. Diffuse soft tissue edema is noted tracking about the pancreas, though it appears separate from the pancreas. Spleen: Complex fluid is noted tracking about the spleen. The spleen is grossly unremarkable. Adrenals/Urinary Tract: The adrenal glands are grossly unremarkable in appearance,  though the left adrenal gland is not well assessed due to the adjacent mass described below. The kidneys are unremarkable in appearance. There is no evidence of hydronephrosis. No renal or ureteral stones are identified. No perinephric stranding is seen. Stomach/Bowel: There is a large 7.6 x 7.3 x 6.3 cm mass noted at the posterior aspect of the gastric fundus. On sagittal images, this appears to arise within the gastric wall. This is suspicious for malignancy, with associated perforation, given complex ascites throughout the abdomen and pelvis. It is difficult to determine whether the ascites reflects primarily gastric contents or blood. There is no evidence of active contrast extravasation at this time. There appears to be diffuse gastric wall thickening, concerning for underlying gastritis. The small bowel is grossly unremarkable in appearance, aside from fluid tracking about small bowel loops. The appendix is normal in caliber, without evidence of appendicitis. The colon is unremarkable in appearance. Soft tissue inflammation tracks about the splenic flexure of the colon, reflecting underlying mesenteric edema secondary to the perforation. Vascular/Lymphatic: Scattered calcification is seen along the abdominal aorta and its branches. The abdominal aorta is otherwise grossly unremarkable. The inferior vena cava is grossly unremarkable. No retroperitoneal lymphadenopathy is seen. No pelvic sidewall lymphadenopathy is identified. Reproductive: The bladder is mildly distended and grossly unremarkable. The prostate remains normal in size. Other: As described above, there is small to moderate volume complex relatively high attenuation ascites within the abdomen and pelvis. This could reflect gastric contents, though the lack of free intraperitoneal air is unusual. Alternatively, it could reflect hemorrhage from the gastric mass. Musculoskeletal: No acute osseous abnormalities are identified. The visualized musculature  is unremarkable in appearance. IMPRESSION: 1. Large 7.6 x 7.3 x  6.3 cm mass at the posterior aspect of the gastric fundus. On sagittal images, this appears to arise within the gastric wall. This is suspicious for malignancy, with associated perforation, given complex ascites throughout the abdomen and pelvis. 2. It is difficult to determine whether the small to moderate volume complex ascites reflects primarily gastric contents or blood. The lack of free intraperitoneal air suggests against gastric contents. Hemorrhage from the gastric mass could have such an appearance. 3. Diffuse soft tissue edema tracks about the mesentery of the upper abdomen, likely reflecting the perforation of the adjacent gastric mass. 4. Scattered coronary artery calcifications noted. 5. Scattered aortic atherosclerosis. Critical Value/emergent results were called by telephone at the time of interpretation on 02/04/2017 at 4:25 am to Dr. Dione Booze, who verbally acknowledged these results. Electronically Signed   By: Roanna Raider M.D.   On: 02/04/2017 04:37   Dg Chest Portable 1 View  Result Date: 02/04/2017 CLINICAL DATA:  Chest pain with shortness of breath EXAM: PORTABLE CHEST 1 VIEW COMPARISON:  None. FINDINGS: The heart size and mediastinal contours are within normal limits. Both lungs are clear. The visualized skeletal structures are unremarkable. IMPRESSION: No active disease. Electronically Signed   By: Jasmine Pang M.D.   On: 02/04/2017 02:48    Microbiology: Recent Results (from the past 240 hour(s))  Culture, blood (Routine X 2) w Reflex to ID Panel     Status: None (Preliminary result)   Collection Time: 02/04/17  6:39 AM  Result Value Ref Range Status   Specimen Description BLOOD LEFT HAND  Final   Special Requests   Final    BOTTLES DRAWN AEROBIC AND ANAEROBIC Blood Culture adequate volume   Culture NO GROWTH 1 DAY  Final   Report Status PENDING  Incomplete  Culture, blood (Routine X 2) w Reflex to ID Panel      Status: None (Preliminary result)   Collection Time: 02/04/17  6:39 AM  Result Value Ref Range Status   Specimen Description BLOOD RIGHT HAND  Final   Special Requests IN PEDIATRIC BOTTLE Blood Culture adequate volume  Final   Culture NO GROWTH 1 DAY  Final   Report Status PENDING  Incomplete     Labs: Basic Metabolic Panel:  Recent Labs Lab 02/04/17 0231 02/05/17 0336  NA 125* 138  K 3.6 3.7  CL 92* 104  CO2 22 27  GLUCOSE 156* 134*  BUN 8 <5*  CREATININE 0.74 0.80  CALCIUM 8.2* 8.3*   Liver Function Tests:  Recent Labs Lab 02/04/17 0213  AST 29  ALT 16*  ALKPHOS 38  BILITOT 0.8  PROT 6.6  ALBUMIN 3.8    Recent Labs Lab 02/04/17 0213  LIPASE 29   No results for input(s): AMMONIA in the last 168 hours. CBC:  Recent Labs Lab 02/04/17 0231 02/05/17 0336  WBC 15.1* 9.5  NEUTROABS 12.5* 7.0  HGB 9.5* 8.2*  HCT 28.8* 25.0*  MCV 84.0 84.7  PLT 181 155   Cardiac Enzymes: No results for input(s): CKTOTAL, CKMB, CKMBINDEX, TROPONINI in the last 168 hours. BNP: BNP (last 3 results) No results for input(s): BNP in the last 8760 hours.  ProBNP (last 3 results) No results for input(s): PROBNP in the last 8760 hours.  CBG:  Recent Labs Lab 02/04/17 0757 02/05/17 0808  GLUCAP 129* 146*       Signed:  Edsel Petrin  [facility] 02/05/2017, 4:48 PM

## 2017-02-05 NOTE — Progress Notes (Signed)
[facility] Progress Note  1 Day Post-Op  Subjective: CC:  Reports generalized abdominal discomfort but denies pain. Tolerating clear liquids without nausea, vomiting, or worsening of pain. +flatus. No Bm since admission.   Objective: Vital signs in last 24 hours: Temp:  [98.7 F (37.1 C)-100.2 F (37.9 C)] 99.3 F (37.4 C) (08/14 0602) Pulse Rate:  [82-97] 88 (08/14 0602) Resp:  [16-23] 17 (08/14 0602) BP: (116-155)/(54-68) 116/56 (08/14 0602) SpO2:  [98 %-100 %] 98 % (08/14 0602) Weight:  [98 kg (216 lb)] 98 kg (216 lb) (08/13 1300) Last BM Date: 02/03/17  Intake/Output from previous day: 08/13 0701 - 08/14 0700 In: 1281.3 [I.V.:1131.3; IV Piggyback:150] Out: 8413 [Urine:2550] Intake/Output this shift: No intake/output data recorded.  PE: Gen:  Alert, NAD, pleasant and cooperative Card:  Regular rate and rhythm, pedal pulses 2+ BL Pulm:  Normal effort, clear to auscultation bilaterally Abd: Soft, non-tender, non-distended, bowel sounds present in all 4 quadrants Skin: warm and dry, no rashes  Psych: A&Ox3   Lab Results:   Recent Labs  02/04/17 0231 02/05/17 0336  WBC 15.1* 9.5  HGB 9.5* 8.2*  HCT 28.8* 25.0*  PLT 181 155   BMET  Recent Labs  02/04/17 0231 02/05/17 0336  NA 125* 138  K 3.6 3.7  CL 92* 104  CO2 22 27  GLUCOSE 156* 134*  BUN 8 <5*  CREATININE 0.74 0.80  CALCIUM 8.2* 8.3*   PT/INR  Recent Labs  02/04/17 0639  LABPROT 13.9  INR 1.06   CMP     Component Value Date/Time   NA 138 02/05/2017 0336   K 3.7 02/05/2017 0336   CL 104 02/05/2017 0336   CO2 27 02/05/2017 0336   GLUCOSE 134 (H) 02/05/2017 0336   BUN <5 (L) 02/05/2017 0336   CREATININE 0.80 02/05/2017 0336   CALCIUM 8.3 (L) 02/05/2017 0336   PROT 6.6 02/04/2017 0213   ALBUMIN 3.8 02/04/2017 0213   AST 29 02/04/2017 0213   ALT 16 (L) 02/04/2017 0213   ALKPHOS 38 02/04/2017 0213   BILITOT 0.8 02/04/2017 0213   GFRNONAA >60 02/05/2017 0336   GFRAA >60  02/05/2017 0336   Lipase     Component Value Date/Time   LIPASE 29 02/04/2017 0213       Studies/Results: Ct Abdomen Pelvis W Contrast  Result Date: 02/04/2017 CLINICAL DATA:  Acute onset of vomiting and generalized chest pain. Diaphoresis and near syncope. Initial encounter. EXAM: CT ABDOMEN AND PELVIS WITH CONTRAST TECHNIQUE: Multidetector CT imaging of the abdomen and pelvis was performed using the standard protocol following bolus administration of intravenous contrast. CONTRAST:  145mL ISOVUE-300 IOPAMIDOL (ISOVUE-300) INJECTION 61% COMPARISON:  None. FINDINGS: Lower chest: The visualized lung bases are clear. Scattered coronary artery calcifications are seen. Hepatobiliary: The liver is unremarkable in appearance. The gallbladder is unremarkable in appearance. The common bile duct remains normal in caliber. Mild complex ascites is seen tracking about the liver. Pancreas: The pancreas is grossly unremarkable in appearance. Diffuse soft tissue edema is noted tracking about the pancreas, though it appears separate from the pancreas. Spleen: Complex fluid is noted tracking about the spleen. The spleen is grossly unremarkable. Adrenals/Urinary Tract: The adrenal glands are grossly unremarkable in appearance, though the left adrenal gland is not well assessed due to the adjacent mass described below. The kidneys are unremarkable in appearance. There is no evidence of hydronephrosis. No renal or ureteral stones are identified. No perinephric stranding is seen. Stomach/Bowel: There is a large 7.6  x 7.3 x 6.3 cm mass noted at the posterior aspect of the gastric fundus. On sagittal images, this appears to arise within the gastric wall. This is suspicious for malignancy, with associated perforation, given complex ascites throughout the abdomen and pelvis. It is difficult to determine whether the ascites reflects primarily gastric contents or blood. There is no evidence of active contrast extravasation at  this time. There appears to be diffuse gastric wall thickening, concerning for underlying gastritis. The small bowel is grossly unremarkable in appearance, aside from fluid tracking about small bowel loops. The appendix is normal in caliber, without evidence of appendicitis. The colon is unremarkable in appearance. Soft tissue inflammation tracks about the splenic flexure of the colon, reflecting underlying mesenteric edema secondary to the perforation. Vascular/Lymphatic: Scattered calcification is seen along the abdominal aorta and its branches. The abdominal aorta is otherwise grossly unremarkable. The inferior vena cava is grossly unremarkable. No retroperitoneal lymphadenopathy is seen. No pelvic sidewall lymphadenopathy is identified. Reproductive: The bladder is mildly distended and grossly unremarkable. The prostate remains normal in size. Other: As described above, there is small to moderate volume complex relatively high attenuation ascites within the abdomen and pelvis. This could reflect gastric contents, though the lack of free intraperitoneal air is unusual. Alternatively, it could reflect hemorrhage from the gastric mass. Musculoskeletal: No acute osseous abnormalities are identified. The visualized musculature is unremarkable in appearance. IMPRESSION: 1. Large 7.6 x 7.3 x 6.3 cm mass at the posterior aspect of the gastric fundus. On sagittal images, this appears to arise within the gastric wall. This is suspicious for malignancy, with associated perforation, given complex ascites throughout the abdomen and pelvis. 2. It is difficult to determine whether the small to moderate volume complex ascites reflects primarily gastric contents or blood. The lack of free intraperitoneal air suggests against gastric contents. Hemorrhage from the gastric mass could have such an appearance. 3. Diffuse soft tissue edema tracks about the mesentery of the upper abdomen, likely reflecting the perforation of the  adjacent gastric mass. 4. Scattered coronary artery calcifications noted. 5. Scattered aortic atherosclerosis. Critical Value/emergent results were called by telephone at the time of interpretation on 02/04/2017 at 4:25 am to Dr. Delora Fuel, who verbally acknowledged these results. Electronically Signed   By: Garald Balding M.D.   On: 02/04/2017 04:37   Dg Chest Portable 1 View  Result Date: 02/04/2017 CLINICAL DATA:  Chest pain with shortness of breath EXAM: PORTABLE CHEST 1 VIEW COMPARISON:  None. FINDINGS: The heart size and mediastinal contours are within normal limits. Both lungs are clear. The visualized skeletal structures are unremarkable. IMPRESSION: No active disease. Electronically Signed   By: Donavan Foil M.D.   On: 02/04/2017 02:48    Anti-infectives: Anti-infectives    Start     Dose/Rate Route Frequency Ordered Stop   02/04/17 1000  piperacillin-tazobactam (ZOSYN) IVPB 3.375 g     3.375 g 12.5 mL/hr over 240 Minutes Intravenous Every 8 hours 02/04/17 0508     02/04/17 0600  piperacillin-tazobactam (ZOSYN) IVPB 3.375 g  Status:  Discontinued     3.375 g 100 mL/hr over 30 Minutes Intravenous Every 8 hours 02/04/17 0505 02/04/17 0507   02/04/17 0415  piperacillin-tazobactam (ZOSYN) IVPB 3.375 g     3.375 g 100 mL/hr over 30 Minutes Intravenous  Once 02/04/17 0412 02/04/17 0507     Assessment/Plan Gastric fundus mass - afebrile, VSS, no reported melena  - CT scan consistent with gastric mass with what appears to be  possible perforation with bleeding into gastric wall/lesser sac of stomach.  - s/p EGD 8/13 Dr. Fuller Plan - submucosal mass; non-bleeding; BX taken, low suspicion this will be diagnostic per GI. - recommend EUS/FNA for definitive diagnosis, r/o GIST. If patient stable and tolerating diet this can be performed outpatient.   - patient can follow up with Dr. Stark Klein, surgical oncologist, for further surgical plans. - ok to advance diet from surgical perspective    Normocytic anemia - FOBT negative; saline lock IV. Monitor CBC. Pt refusing blood products as he is a Jehovah's witness.  FEN: Reg ID: none VTE: SCD's, chemical VTE held 2/2 anemia   Dispo: if hgb remains stable, no s/s further bleeding, and tolerating diet, will be stable for discharge with continued outpatient workup of mass tomorrow.   LOS: 1 day    [facility] 02/05/2017, 10:38 AM Pager: [phone] Consults: [phone] Mon-Fri 7:00 am-4:30 pm Sat-Sun 7:00 am-11:30 am

## 2017-02-05 NOTE — Progress Notes (Signed)
Gastric biopsies returned: BENIGN GASTRIC MUCOSA WITH EDEMA AND VASCULAR CONGESTION Discussed transfer to a tertiary care center with Dr. Ree Kida for further mgmt.

## 2017-02-06 DIAGNOSIS — C49A2 Gastrointestinal stromal tumor of stomach: Principal | ICD-10-CM

## 2017-02-07 DIAGNOSIS — C49A2 Gastrointestinal stromal tumor of stomach: Principal | ICD-10-CM

## 2017-02-09 DIAGNOSIS — C49A2 Gastrointestinal stromal tumor of stomach: Principal | ICD-10-CM

## 2017-02-09 LAB — CULTURE, BLOOD (ROUTINE X 2)
CULTURE: NO GROWTH
Culture: NO GROWTH
Special Requests: ADEQUATE
Special Requests: ADEQUATE

## 2017-02-11 DIAGNOSIS — C49A2 Gastrointestinal stromal tumor of stomach: Principal | ICD-10-CM

## 2017-02-12 DIAGNOSIS — C49A2 Gastrointestinal stromal tumor of stomach: Principal | ICD-10-CM

## 2017-02-12 MED ORDER — EPOETIN ALFA 10,000 UNIT/ML INJECTION SOLUTION
0 refills | 0 days
Start: 2017-02-12 — End: 2017-02-12

## 2017-02-14 MED ORDER — PANTOPRAZOLE 40 MG TABLET,DELAYED RELEASE
ORAL_TABLET | Freq: Every day | ORAL | 0 refills | 0.00000 days | Status: CP
Start: 2017-02-14 — End: 2018-02-14

## 2017-02-14 MED ORDER — ACETAMINOPHEN 500 MG TABLET
ORAL_TABLET | Freq: Three times a day (TID) | ORAL | 0 refills | 0.00 days
Start: 2017-02-14 — End: 2018-01-07

## 2017-02-14 MED ORDER — OXYCODONE 10 MG TABLET
ORAL_TABLET | ORAL | 0 refills | 0.00 days | Status: CP | PRN
Start: 2017-02-14 — End: 2017-02-19

## 2017-02-14 MED ORDER — PRENATAL VITAMIN WITH CALCIUM NO.72-IRON 27 MG-FOLIC ACID 1 MG TABLET: 1 | each | 0 refills | 0 days

## 2017-02-14 MED ORDER — DOCUSATE SODIUM 100 MG CAPSULE
ORAL_CAPSULE | Freq: Two times a day (BID) | ORAL | 0 refills | 0.00 days
Start: 2017-02-14 — End: 2017-03-16

## 2017-02-14 MED ORDER — PANTOPRAZOLE 40 MG TABLET,DELAYED RELEASE: 40 mg | tablet | Freq: Every day | 0 refills | 0 days | Status: AC

## 2017-02-14 MED ORDER — PRENATAL VITAMIN WITH CALCIUM NO.72-IRON 27 MG-FOLIC ACID 1 MG TABLET
ORAL_TABLET | Freq: Every day | ORAL | 0 refills | 0.00000 days | Status: CP
Start: 2017-02-14 — End: 2017-02-14

## 2017-02-14 MED FILL — PANTOPRAZOLE/40MG/TBEC: PANTOPRAZOLE/40MG/TBEC | 30 days supply | Qty: 30 | Fill #0

## 2017-02-14 MED FILL — OXYCODONE HCL/10MG/TABS: OXYCODONE HCL/10MG/TABS | 7 days supply | Qty: 40 | Fill #0

## 2017-02-14 MED FILL — PREPLUS/27-1MG/TABS: PREPLUS/27-1MG/TABS | 30 days supply | Qty: 30 | Fill #0

## 2017-02-20 ENCOUNTER — Ambulatory Visit: Admission: RE | Admit: 2017-02-20 | Discharge: 2017-02-20 | Disposition: A | Discharge status: Home / Self Care

## 2017-02-20 DIAGNOSIS — C49A2 Gastrointestinal stromal tumor of stomach: Secondary | ICD-10-CM

## 2017-02-20 DIAGNOSIS — D62 Acute posthemorrhagic anemia: Principal | ICD-10-CM

## 2017-03-06 ENCOUNTER — Ambulatory Visit
Admission: RE | Admit: 2017-03-06 | Discharge: 2017-03-06 | Disposition: A | Discharge status: Home / Self Care | Admitting: Hematology & Oncology

## 2017-03-06 ENCOUNTER — Ambulatory Visit
Admission: RE | Admit: 2017-03-06 | Discharge: 2017-03-06 | Disposition: A | Discharge status: Home / Self Care | Admitting: Surgery

## 2017-03-06 DIAGNOSIS — C49A2 Gastrointestinal stromal tumor of stomach: Principal | ICD-10-CM

## 2017-03-06 MED ORDER — IMATINIB 400 MG TABLET
Freq: Every day | ORAL | 3 refills | 0.00000 days | Status: CP
Start: 2017-03-06 — End: 2017-03-06

## 2017-03-06 MED ORDER — IMATINIB 400 MG TABLET: 400 mg | each | 3 refills | 0 days

## 2017-03-06 NOTE — Unmapped (Signed)
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      If the spleen is surgically removed or does not function properly then some minor infections can develop into infections that are more serious.     In order to prevent infections after splenectomy we will plan to give the following vaccinations:      Pneumococcal vaccine.     1. You will receive PCV13 as your initial dose.    2. You should receive PPSV23 at 8 weeks after the initial dose.     3. You should get a booster of the PPSV23 every 5 years.        H. Influenzae type B (Hib) vaccine.    1. You will receive one initial dose.  No need for booster.    Meningococcal vaccine.    1. You will receive Menactra and Bexsero as your initial dose.    2. You will need a second dose of Menactra and Bexsero at 8 weeks.   3. You will need a booster of Menactra every 5 years.

## 2017-03-06 NOTE — Unmapped (Signed)
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       Reason for Visit: Postoperative follow-up     HPI:  Overall he has continued to do well. His appetite has started to return and his energy/activity level has started to improve as well. He has no nausea or other complaints.    Exam:  Afeb/Vitals stable  In general he appears in good health  The incision on the abdomen is healing  There are no other significant physical findings    Diagnostic Studies:  Hemoglobin at his last visit was 9.      Assessment:   Overall making progress. Appropriate response with his hemoglobin at last visit he and I do not think it needs to be repeated today since he is clinically doing well. He is continue to recover very well from surgery.    We again discussed the rationale for adjuvant Gleevec since this was a ruptured tumor. He was seen by the oncology team today to discuss that as well.    Plan:   Return to see [place] on an as-needed basis is going to have close follow-up with the oncology team.    At the time of his next visit with the oncologists, he will need to complete his post-splenectomy vaccines.  These will be Menactra, Bexsero and PCV13 (pneumococal conjugate 13).

## 2017-03-06 NOTE — Unmapped (Signed)
Pharmacist Oral Chemotherapy Education    Andrew Beasley is a 56 y.o. male with GIST who I have counseled prior to the initiation of oral chemotherapy    Oral chemotherapy regimen: Imatinib 400 mg po daily  Tentative Start Date: Pending (patient is deciding whether or not to begin treatment)  Pharmacy: Pending    Side effects discussed included but were not limited to: nausea/vomiting, complications of myelosuppression, diarrhea, edema, rash, muscle cramps, hepatotoxicity, and fatigue.  Proper administration of the medication, medication schedule, and oral chemotherapy handling precautions were reviewed.    Drug interactions: Patient reports using ibuprofen (alternating with acetaminophen) to manage musculoskeletal pain. The ibuprofen may potentially decrease the clinical response to imatinib. Counseled patient that he should not use ibuprofen should he start the imatinib, but utilize acetaminophen and a different NSAID instead if needed (naproxen), as this was not found to have the same effect on the imatinib's ef as the ibuprofen.    Medication acquistion: TBD pending patient decision to start imatinib, and whether we apply for Pharmacy assistance or manufacturer's assistance.    Handout provided: Chemotherapy handout from [facility]    Patient verbalized understanding of the above information as well as how to contact the Team with any questions/concerns.    Approximate time with patient:15 minutes    Abigail Butts, PharmD  PGY2 Hematology/Oncology Pharmacy Resident  Pager: [phone]    Raelene Bott, PharmD, BCOP, CPP

## 2017-03-06 NOTE — Unmapped (Signed)
It was a pleasure meeting you in clinic today. 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 Given that the cancer had perforated, I recommend taking GLEEVEC to reduce the risk of the cancer coming back. This works by targeting a protein on GIST cancer cells called c-KIT. Our recommendation is for a 3 year treatment course. You received information on Gleevec today and we will start to get this approved by your insurance.     We will send part of the cancer removed for a molecular test to ensure your cancer cells would respond to Gleevec. I recommend starting treatment in about 4 weeks.     Berline Semrad J. Dixie Dials, MD  [address]  [place], [place] [address]  [phone] - fax    For questions or concerns during regular business hours, please call the cancer hospital phone room number at [phone].     For after hours questions or concerns, please call the hospital operator at [phone] and ask to speak to the adult oncologist on call.

## 2017-03-06 NOTE — Unmapped (Signed)
REFERRING PROVIDER:   Camillo Flaming, Md  [address]  [id number] [facility]  [place], [place] [address]    PRIMARY CARE PROVIDER:  Caroline More, MD  [address] [facility] [place] [address]    REASON FOR CONSULT: Mr.  Sonam Huelsmann is a 56 y.o. male who is seen today as a new patient in consultation at the request of Dr. 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     HISTORY OF PRESENT ILLNESS: Mr. 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 He reports feeling perfectly well until one night in August when he went out to eat and came home feeling like the food was stuck in his stomach like concrete. He then felt sweaty and passed out. He was taken to a local ER where a CT scan showed a large 7.6 x 7.3 x 6.3 cm mass noted at the posterior aspect of the gastric fundus with evidence of perforation. He was profoundly anemic with a hemoglobin of 7.5gm/dL. He was transferred to [facility] and an EUS was performed that showed a gastric ulcer overlying a subepithelial tumor. Biopsy was performed and confirmed a GIST. He was taken to the OR the following day on 02/07/17 at which point he was noted to have a perforated tumor with approximately a liter to a liter and a half of intraperitoneal blood.  It was arising from the cardia of the stomach. He underwent a partial gastrectomy as well as a splenectomy. Final pathology confirmed a 3.5cm well differentiated GIST with 1 mitoses per 50 hpf. The tumor involved the surrounding peritoneum. The gastric margin was negative. One lymph node was negative.     Since being at home, he reports an uneventful recovery. Just in the past week, he feels his appetite is returning. He is eating smaller portions and trying to increase his protein and calories. He has no pain or nausea with swallowing. He is still taking Tylenol and Ibuprofen. He has last about 10 lbs since his surgery but feels this is stabilizing. He was working as a window cleaner previously and is planning to return to work when cleared.     PAST MEDICAL HISTORY  Past Medical History:   Diagnosis Date   ??? 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 acetaminophen (TYLENOL) 500 MG tablet Take 2 tablets (1,000 mg total) by mouth Three (3) times a day. 30 tablet 0   ??? cyanocobalamin, vitamin B-12, 5,000 mcg TbIE Take 1 tablet by mouth daily with breakfast.     ??? docusate sodium (COLACE) 100 MG capsule Take 1 capsule (100 mg total) by mouth Two (2) times a day. 60 capsule 0   ??? Lactobacillus rhamnosus GG (CULTURELLE) 10 billion cell capsule Take 1 capsule by mouth daily.     ??? multivitamin, prenatal, folic acid-iron, 27-1 mg Tab Take 1 tablet by mouth daily. 30 tablet 0   ??? pantoprazole (PROTONIX) 40 MG tablet Take 1 tablet (40 mg total) by mouth daily. 30 tablet 0     No current facility-administered medications on file prior to visit.      FAMILY HISTORY  No significant family history of cancer    SOCIAL HISTORY: The patient is married and presents with  his wife Nettie Elm today. They have one son who lives in [place]. He does not smoke cigarettes. He did drink a few beers each night prior to surgery. He works as a Magazine features editor.     REVIEW OF SYSTEMS  A complete review of systems was obtained and was negative except for those symptoms listed in the HPI.     PHYSICAL EXAM  Vitals:    03/06/17 0918   BP: 140/69   Pulse: 55   Resp: 20   Temp: 37.2 ??C (99 ??F)   SpO2: 100%     Wt Readings from Last 3 Encounters:   03/06/17 85.7 kg (189 lb)   02/20/17 89 kg (196 lb 3.4 oz)   02/05/17 94.6 kg (208 lb 8.9 oz)     GEN: Awake and alert, pleasant appearing male in no acute distress  HEENT: Pupils equally round without scleral icterus  LYMPH: No palpable cervical or clavicular lymph nodes CV: Regular rhythm with normal rate, no murmurs  LUNGS: Clear to auscultation bilaterally without wheeze or rhonchi  SKIN: No rashes, petechiae or jaundice noted  ABD: Soft with no distention or palpable masses. Midline incision is without erythema or induration. Bowel sounds are active. Mild tenderness to palpation along the surgical incision.   PYSCH: Alert and oriented to person, place and time  EXT: No edema noted of the lower extremity      DIAGNOSTIC DATA: I have personally reviewed the patient's records in EPIC including but not limited to clinic notes, admission notes, operative reports, pathology reports, lab results and imaging results.     RADIOLOGY RESULTS: I have personally reviewed the patient's CT scan on PACS that shows the above noted results.     LABS:   Lab Results   Component Value Date    WBC 10.6 02/20/2017    HGB 9.2 (L) 02/20/2017    HCT 32.4 (L) 02/20/2017    PLT 770 (H) 02/20/2017       Lab Results   Component Value Date    NA 135 02/11/2017    K 3.7 02/11/2017    CL 99 02/11/2017    CO2 29.0 02/11/2017    BUN 7 02/11/2017    CREATININE 0.54 (L) 02/11/2017    GLU 155 02/11/2017    CALCIUM 7.7 (L) 02/11/2017    MG 2.0 02/09/2017    PHOS 2.1 (L) 02/09/2017       Lab Results   Component Value Date    BILITOT 0.7 02/06/2017    BILIDIR 0.20 02/06/2017    PROT 5.7 (L) 02/06/2017    ALBUMIN 3.2 (L) 02/06/2017    ALT 20 02/06/2017    AST 18 (L) 02/06/2017    ALKPHOS 39 02/06/2017       Lab Results   Component Value Date    INR 1.12 02/06/2017    APTT 24.5 (L) 02/06/2017     PATHOLOGY RESULTS:   Final Diagnosis   A: Stomach and spleen, partial gastrectomy and splenectomy  - Gastrointestinal stromal tumor (GIST), low grade (G1), at least 3.5 cm in greatest dimension  - GIST involves serosal surface of stomach and surrounding peritoneal tissue  - Gastric mucosal margin negative for GIST  - One lymph node, negative for metastatic GIST (0/1)  - Spleen with congestion and with subcapsular hemorrhage  - Please see synoptic report  ??  B: Omentum, omentectomy  - Dense fibroadipose tissue, consistent with omentum, with hemorrhage, granulation tissue, and acute and chronic inflammation  -  Negative for GIST  ??  C: Soft tissue, gastric GIST soft peritoneal tissue, excision  - Fragments of gastrointestinal stromal tumor (GIST) with surrounding hemorrhage, granulation tissue, and necrosis          ASSESSMENT  Mr. Jyren Cerasoli is a pleasant 56 y.o. male who presents today as a new patient in consultation for further evaluation of gastric GIST that was perforated at the time of resection. He presents today for a discussion regarding adjuvant therapy.     I have discussed with the patient the implications of a diagnosis of gastric GIST - he does have high risk disease defined by perforation. I estimate that he has about a 30-40% recurrence risk and therefore have discussed the rationale for adjuvant imatinib. We discussed the goal of decreasing or delaying recurrence with a 3 year course of imatinib. I reviewed the clinical trial data that compared 1 vs 3 years of adjuvant therapy in patients with resected high-risk GIST that showed an improvement in both recurrence free and overall survival. Recurrence would typically be in the metastatic setting (liver, lung, peritoneum, etc) and is not always amenable to cure or surgical resection. We discussed the potential side effects of imatinib and received formal teaching from [place] today. We also discussed the role of molecular sequencing to gauge whether his tumor possesses a CKIT mutation that confers sensitivity to imatinib - to gain this information, he will be consented to [facility] today.     He is only 4 weeks out from his operation and just starting to turn the corner in terms of appetite and weight gain. I recommend starting treatment closer to 8 weeks out from his operation to allow for additional recovery. We discussed seeing him back one month after starting treatment and then every 3 months for clinical exams and lab checks. We will plan for a CT scan every 6 months for the first 2 years and then annually at that point.     It was a pleasure meeting Mr. Woodfield and his wife in clinic today. We will let him know when his imatinib is authorized and I will plan to see him back 4 weeks after starting therapy for further evaluation.     PLAN:   1. 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

## 2017-03-06 NOTE — Unmapped (Signed)
Informed Consent Documentation  Study: STRATA     I met with patient Andrew Beasley and family to discuss consent for the STRATA trial.  The protocol was reviewed including discussion of risks & benefits, confidentiality, time commitments involved, study contact list, and the option to withdraw at any time. Alternatives to study participation were discussed.  The patient was given reasonable time to consider participation in the study, in the absence of coercion or undue influence. Patient was offered an opportunity for questions and these questions were answered.  Patient verbalized understanding of information presented.      The patient has signed the study informed consent and HIPAA Authorization Form as follows:   X     In my presence  A copy of the informed consent and HIPAA Authorization Form was given to the patient.  The informed consent and HIPAA Authorization Form will be placed in the regulatory files in the Office of Clinical & Translational Research. Every effort to maintain confidentiality will be employed.     Date: 03/06/2017  Time: 10:30 AM  X     HIPAA Form Signature:  Same as above        Plan: Histology will cut slides from 02/07/2017 biopsy to be sent to [facility] for Next-Generation Sequencing. Once report is generated it will be put in pts Labs tab in their EMR.

## 2017-03-13 NOTE — Unmapped (Unsigned)
IMATINIB APPROVED FOR $0

## 2017-03-19 NOTE — Unmapped (Signed)
I called Andrew Beasley to let him know that the prior authorization has been approved for imatinib and his copay is $0.  He stated that he is still thinking about whether he wants to start this medication and has not made a decision.  I provided him my contact information if he had additional questions or concerns.  He plans to follow-up with Dr. Dixie Dials when he makes his final decision.

## 2017-05-01 ENCOUNTER — Ambulatory Visit
Admission: RE | Admit: 2017-05-01 | Discharge: 2017-05-01 | Disposition: A | Discharge status: Home / Self Care | Payer: BC Managed Care – PPO

## 2017-05-01 ENCOUNTER — Ambulatory Visit
Admission: RE | Admit: 2017-05-01 | Discharge: 2017-05-01 | Disposition: A | Discharge status: Home / Self Care | Admitting: Hematology & Oncology

## 2017-05-01 DIAGNOSIS — C49A2 Gastrointestinal stromal tumor of stomach: Principal | ICD-10-CM

## 2017-05-01 NOTE — Unmapped (Signed)
PRIMARY CARE PHYSICIAN  Caroline More, MD  [address] [facility] [place] [address]    CONSULTING PHYSICIANS  Dr. Tollie Eth - surgical oncology        REASON FOR VISIT: Follow-up for gastric GIST    ONCOLOGY THERAPY:   Oncology History    DIAGNOSIS: High risk gastric GIST    PRIOR THERAPY:   1. Partial gastrectomy with splenectomy (August 2018): 3.5cm perforated GIST with 1 mitoses/HPF    CURRENT PLAN: Active surveillance (patient deferred adjuvant therapy)          HISTORY OF PRESENT ILLNESS:     Andrew Beasley returns to clinic with wife for further evaluation of gastric GIST. He presented with a perforated gastric tumor that was resected in August. He was seen by me post-operatively and we discussed the recommendation for post-operative imatinib. 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 The patient, however, upon further consideration has chosen not to take adjuvant therapy. He has some concerns about the side effects and the fact that his cancer could return even with treatment. He feels well. He has lost about 15 lbs since his operation but denies nausea, vomiting, decreased appetite or dysphagia. He does worry about a tender area around what he calls a pooch around his surgical scar.      PAST MEDICAL HISTORY:   Past Medical History:   Diagnosis Date   ??? 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 They have one son who lives in [place]. He does not smoke cigarettes. He did drink a few beers each night prior to surgery. He works as a Magazine features editor.     MEDICATIONS: Reviewed in EPIC    REVIEW OF SYSTEMS  A complete review of systems was obtained and was negative except for those symptoms listed in the HPI     PHYSICAL EXAM  Vitals:    05/01/17 1017   BP: 138/74   Pulse: 55   Resp: 16   Temp: 37.1 ??C (98.8 ??F)   SpO2: 100%     Wt Readings from Last 3 Encounters:   05/01/17 82.5 kg (181 lb 14.4 oz)   03/06/17 85.7 kg (189 lb)   02/20/17 89 kg (196 lb 3.4 oz)     GEN: Awake and alert, pleasant appearing male in no acute distress  HEENT: Pupils equally round without scleral icterus  LYMPH: No palpable cervical or clavicular lymph nodes  CV: Regular rhythm with normal rate, no murmurs  LUNGS: Clear to auscultation bilaterally without wheeze or rhonchi  SKIN: No rashes, petechiae or jaundice noted  ABD: Soft and non-tender with no distention or palpable masses. Bowel sounds are active. Surgical scars are without palpable nodularity - there is no hernia on palpation.   PYSCH: Alert and oriented to person, place and time  EXT: No edema noted of the lower extremity    ASSESSMENT: Andrew Beasley is a pleasant 56 y.o. male who presents today for further evaluation of  gastric GIST that was perforated at the time of resection. He presents today for further discussions regarding adjuvant therapy.     I again discussed with the patient the implications of a diagnosis of high risk gastric GIST defined by perforation at the time of diagnosis. I estimate that he has about a 30-40% recurrence  risk and therefore have discussed the rationale for adjuvant imatinib. We discussed the goal of decreasing or delaying recurrence with a 3 year course of imatinib. 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     After a lengthy discussion regarding his concerns about risk vs benefit of imatinib, he has chosen not to take adjuvant therapy. Therefore, we discussed the plan for active surveillance to include clinical visits with CT scans every 6 months for the first 2 years after surgery followed by annual visits. He is amenable to this plan. He has lost a considerable amount of weight since his surgery - he attributes this to changes in his diet. I will plan to see him back in a short time interval with repeat imaging given no other concerning symptoms.     PLAN  1. High risk gastric GIST s/p resection, adjuvant therapy deferred  --RTC in 12 weeks with CT c/a/p as part of active surveillance   --Will have CBC checked as part of annual physical exam with PCP in January

## 2017-05-01 NOTE — Unmapped (Signed)
We discussed the plan for close surveillance to ensure no recurrence of your GIST. Our plan would be for CT scans every 6 months for the first 2 years after your surgery.     Wilberto Console J. Dixie Dials, MD  [address], [address]  [place], [place] [address]  [phone] - fax    For questions or concerns during regular business hours, please call the cancer hospital phone room number at [phone].     For after hours questions or concerns, please call the hospital operator at [phone] and ask to speak to the adult oncologist on call.

## 2017-07-31 ENCOUNTER — Encounter: Admit: 2017-07-31 | Discharge: 2017-07-31 | Payer: PRIVATE HEALTH INSURANCE

## 2017-07-31 DIAGNOSIS — C49A2 Gastrointestinal stromal tumor of stomach: Principal | ICD-10-CM

## 2017-07-31 NOTE — Unmapped (Signed)
The CT scans performed today do not show any evidence of cancer having returned. I recommend repeating them in 6 months.     [place] Kuang J. Dixie Dials, MD  [address], [address]  [place], [place] [address]  [phone] - fax    For questions or concerns during regular business hours, please call the cancer hospital phone room number at [phone].     For after hours questions or concerns, please call the hospital operator at [phone] and ask to speak to the adult oncologist on call.

## 2017-07-31 NOTE — Unmapped (Signed)
PRIMARY CARE PHYSICIAN  Caroline More, MD  [address] [facility] [place] [address]    CONSULTING PHYSICIANS  Dr. Tollie Eth - surgical oncology        REASON FOR VISIT: Follow-up for gastric GIST    ONCOLOGY THERAPY:   Oncology History    DIAGNOSIS: High risk gastric GIST    PRIOR THERAPY:   1. Partial gastrectomy with splenectomy (August 2018): 3.5cm perforated GIST with 1 mitoses/HPF    CURRENT PLAN: Active surveillance (patient deferred adjuvant therapy)          HISTORY OF PRESENT ILLNESS:     Andrew Beasley returns to clinic with wife for further evaluation of gastric GIST. He presented with a perforated gastric tumor that was resected in 01/2017. 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 His appetite has returned and he has put on 20 lb in weight. He has no complaints, in particular no abdominal pain, no nausea, vomiting, diarrhea. He denies any abdominal distension or bloating or peripheral edema. He feels unrestricted in his daily activities. Is due to see his PCP for his annual physical where he will have a repeat CBC to evaluate his normocytic anemia that was noted and persistent post-operatively.      PAST MEDICAL HISTORY:   Past Medical History:   Diagnosis Date   ??? 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 They have one son who lives in [place]. He does not smoke cigarettes. He did drink a few beers each night prior to surgery. He works as a Magazine features editor.     MEDICATIONS: Reviewed in EPIC    REVIEW OF SYSTEMS  A complete review of systems was obtained and was negative except for those symptoms listed in the HPI PHYSICAL EXAM    Vitals:    07/31/17 1134   BP: 161/82   Pulse: 96   Resp: 17   Temp: 36.5 ??C (97.7 ??F)   SpO2: 97%     Wt Readings from Last 3 Encounters:   07/31/17 91.2 kg (201 lb 1.6 oz)   05/01/17 82.5 kg (181 lb 14.4 oz)   03/06/17 85.7 kg (189 lb)     GEN: Awake and alert, pleasant appearing male in no acute distress  HEENT: Pupils equally round without scleral icterus  LYMPH: No palpable cervical or clavicular lymph nodes  CV: Regular rhythm with normal rate, no murmurs  LUNGS: Clear to auscultation bilaterally without wheeze or rhonchi  SKIN: No rashes, petechiae or jaundice noted. 6mm scaly erythatous plaque on right forehead   ABD: Soft and non-tender with no distention or palpable masses. Bowel sounds are active. Surgical scars are without palpable nodularity - there is no hernia on palpation.   PYSCH: Alert and oriented to person, place and time  EXT: No edema noted of the lower extremity    Radiology: I personally reviewed the imaging on PACS which is notable for:  1. No evidence of metastatic disease in the thorax  2. Slight interval increase in portocaval lymph node 0.8 to 1.4cm  3. Small peripherally enhancing fluid collection in the splenectomy  bed likely post-surgical, no clear evidence of disease recurrence.     ASSESSMENT: Andrew Beasley is a pleasant 57 y.o. 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    His imaging today shows no clear evidence of disease recurrence. The patient is clinically well, with normal appetite, increased weight and improved fatigue. We have previously estimated his risk of recurrence at 30%, at which point we would initiate treatment with imatinib given his exon 11 mutation.  We have discussed the need for ongoing surveillance, with imaging every 6 month in addition to clinical examination for the initial 2 years. He will be reviewed by his PCP to ensure his post operative anemia has stabilized and to discuss referral to dermatology for the scaly lesion on his forehead.     PLAN  1. High risk gastric GIST s/p resection, adjuvant therapy deferred  --RTC in 6 months with CT c/a/p as part of active surveillance     2. Supportive Care  --Will have CBC checked as part of annual physical exam with PCP  --Will discuss with PCP on referral to dermatology regarding lesion on forehead    The patient was seen and discussed with Dr. Dayton Martes, MD, MS  Hematology and Oncology Fellow      I saw and evaluated the patient, participating in the key portions of the service.  I reviewed the resident???s note.  I agree with the resident???s findings and plan. Clinically, he is doing remarkably well with no concerning signs or symptoms of recurrence. I have reviewed the CT images - the slight interval increase in size of the porta-caval node is rather subtle and indeterminate. We will monitor on future images. I have asked that he call us with any new symptoms, including abdominal pain/distention/bloating, nausea/vomiting, unexplained weight loss etc. Ronelle Nigh, MD

## 2018-01-07 ENCOUNTER — Encounter
Admit: 2018-01-07 | Discharge: 2018-01-08 | Payer: PRIVATE HEALTH INSURANCE | Attending: Family Medicine | Primary: Family Medicine

## 2018-01-07 DIAGNOSIS — R030 Elevated blood-pressure reading, without diagnosis of hypertension: Secondary | ICD-10-CM

## 2018-01-07 DIAGNOSIS — D649 Anemia, unspecified: Secondary | ICD-10-CM

## 2018-01-07 DIAGNOSIS — Z0000 Encounter for general adult medical examination without abnormal findings: Principal | ICD-10-CM

## 2018-01-07 DIAGNOSIS — L989 Disorder of the skin and subcutaneous tissue, unspecified: Secondary | ICD-10-CM

## 2018-01-20 ENCOUNTER — Encounter: Admit: 2018-01-20 | Discharge: 2018-01-21 | Payer: PRIVATE HEALTH INSURANCE

## 2018-01-20 DIAGNOSIS — D485 Neoplasm of uncertain behavior of skin: Principal | ICD-10-CM

## 2018-01-20 DIAGNOSIS — B351 Tinea unguium: Secondary | ICD-10-CM

## 2018-01-20 DIAGNOSIS — L989 Disorder of the skin and subcutaneous tissue, unspecified: Secondary | ICD-10-CM

## 2018-01-20 DIAGNOSIS — L578 Other skin changes due to chronic exposure to nonionizing radiation: Secondary | ICD-10-CM

## 2018-01-29 ENCOUNTER — Encounter: Admit: 2018-01-29 | Discharge: 2018-01-29 | Payer: PRIVATE HEALTH INSURANCE

## 2018-01-29 DIAGNOSIS — C49A2 Gastrointestinal stromal tumor of stomach: Principal | ICD-10-CM

## 2018-07-30 ENCOUNTER — Ambulatory Visit: Admit: 2018-07-30 | Discharge: 2018-07-30 | Payer: PRIVATE HEALTH INSURANCE

## 2018-07-30 ENCOUNTER — Encounter: Admit: 2018-07-30 | Discharge: 2018-07-30 | Payer: PRIVATE HEALTH INSURANCE

## 2018-07-30 DIAGNOSIS — C49A2 Gastrointestinal stromal tumor of stomach: Principal | ICD-10-CM

## 2019-04-29 ENCOUNTER — Encounter: Admit: 2019-04-29 | Discharge: 2019-04-30 | Payer: PRIVATE HEALTH INSURANCE

## 2019-04-29 DIAGNOSIS — C49A2 Gastrointestinal stromal tumor of stomach: Principal | ICD-10-CM

## 2019-05-06 ENCOUNTER — Encounter: Admit: 2019-05-06 | Discharge: 2019-05-07 | Payer: PRIVATE HEALTH INSURANCE

## 2019-05-06 DIAGNOSIS — C49A2 Gastrointestinal stromal tumor of stomach: Principal | ICD-10-CM

## 2019-05-23 IMAGING — DX DG CHEST 1V PORT
1 series · 1 of 1 positions shown · non-contrast
Comparison: None.

CLINICAL DATA: Chest pain with shortness of breath

EXAM:
PORTABLE CHEST 1 VIEW

[chest ap]
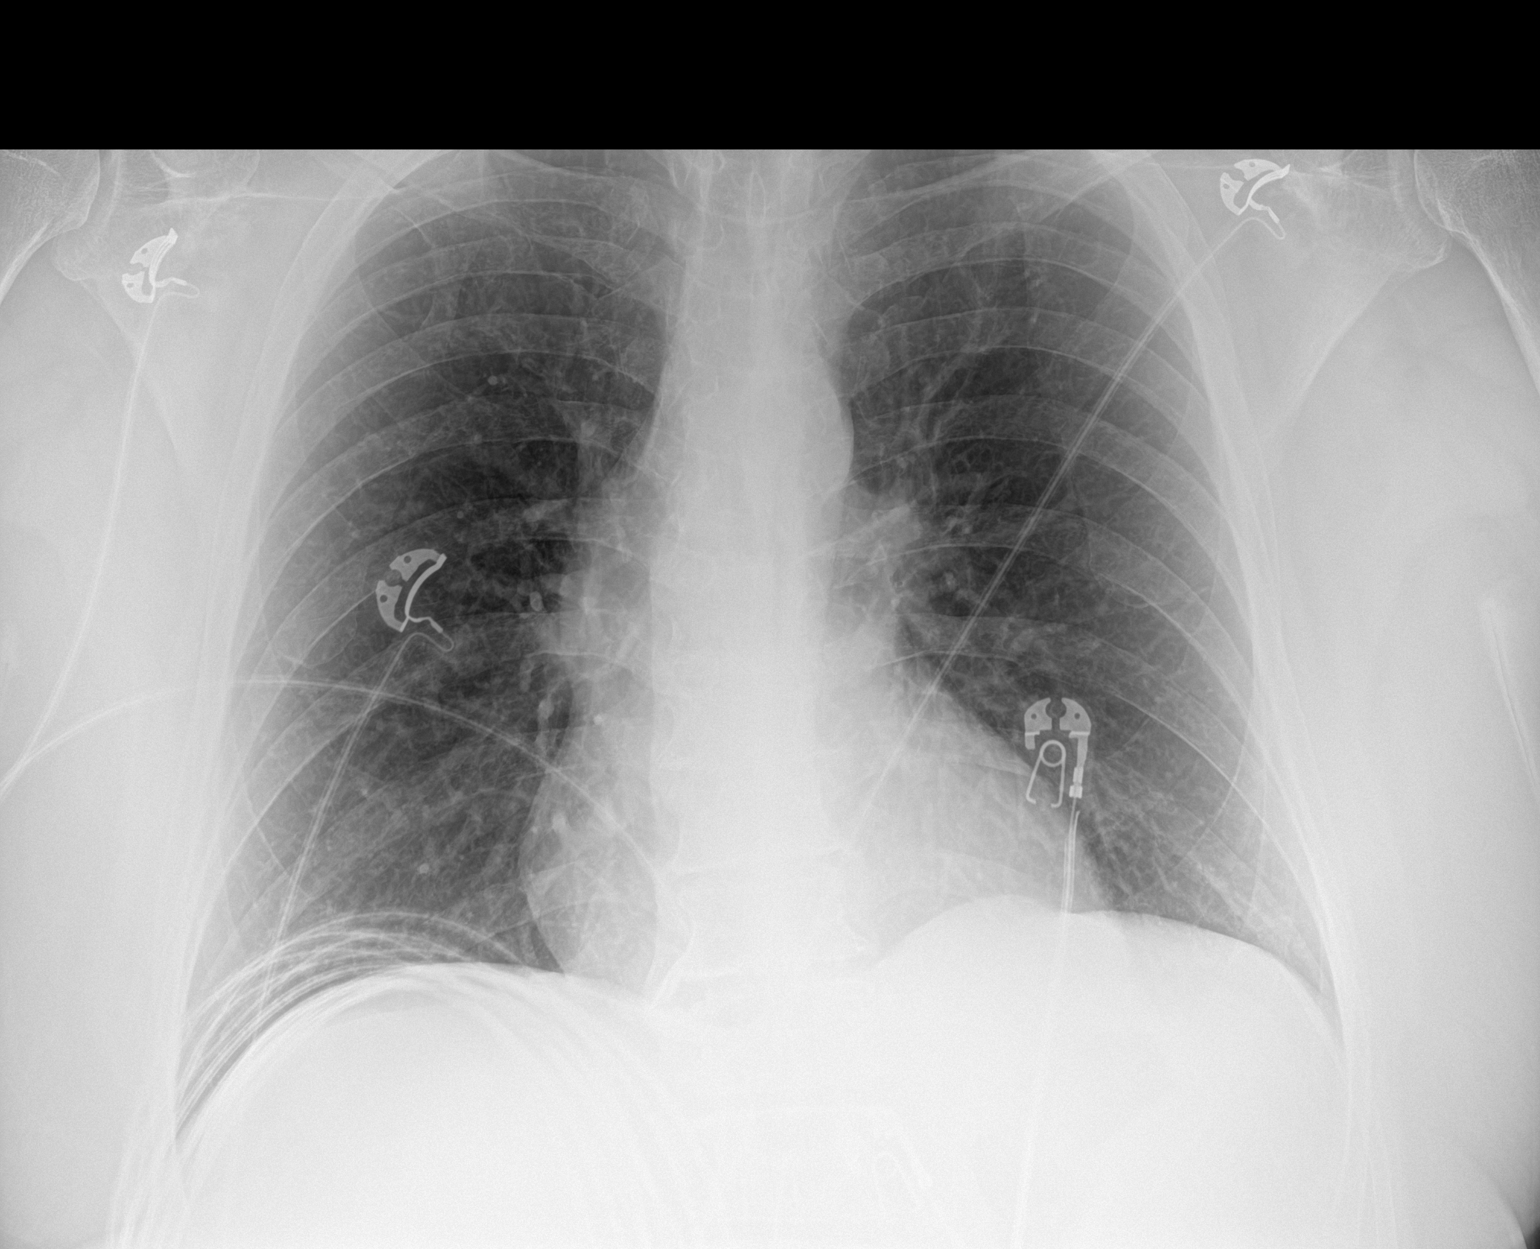

[1 of 1 positions shown; findings below may reference images not displayed]

FINDINGS: The heart size and mediastinal contours are within normal limits.
Both lungs are clear. The visualized skeletal structures are
unremarkable.
IMPRESSION: No active disease.

## 2019-08-05 ENCOUNTER — Encounter: Admit: 2019-08-05 | Discharge: 2019-08-05 | Payer: PRIVATE HEALTH INSURANCE

## 2019-08-05 DIAGNOSIS — C49A2 Gastrointestinal stromal tumor of stomach: Principal | ICD-10-CM

## 2019-10-01 ENCOUNTER — Ambulatory Visit: Payer: Self-pay | Attending: Internal Medicine

## 2019-10-01 DIAGNOSIS — Z23 Encounter for immunization: Secondary | ICD-10-CM

## 2019-10-01 NOTE — Progress Notes (Signed)
Covid-19 Vaccination Clinic  Name:  Samuel Clarke    MRN: [id number] DOB: 11-15-1960  10/01/2019  [name] was observed post Covid-19 immunization for 15 minutes without incident. He was provided with Vaccine Information Sheet and instruction to access the V-Safe system.   [name] was instructed to call 911 with any severe reactions post vaccine: Marland Kitchen Difficulty breathing  . Swelling of face and throat  . A fast heartbeat  . A bad rash all over body  . Dizziness and weakness   Immunizations Administered    Name Date Dose VIS Date Route   Moderna COVID-19 Vaccine 10/01/2019  8:28 AM 0.5 mL 05/26/2019 Intramuscular   Manufacturer: Moderna   Lot: [id number]   NDC: 03474-259-56

## 2019-11-03 ENCOUNTER — Ambulatory Visit: Payer: Self-pay | Attending: Internal Medicine

## 2019-11-03 ENCOUNTER — Ambulatory Visit: Payer: Self-pay

## 2019-11-03 DIAGNOSIS — Z23 Encounter for immunization: Secondary | ICD-10-CM

## 2019-11-03 NOTE — Progress Notes (Signed)
Covid-19 Vaccination Clinic  Name:  Samuel Clarke    MRN: [id number] DOB: 07/07/1960  11/03/2019  Mr. Samuel Clarke was observed post Covid-19 immunization for 15 minutes without incident. He was provided with Vaccine Information Sheet and instruction to access the V-Safe system.   Mr. Samuel Clarke was instructed to call 911 with any severe reactions post vaccine: Marland Kitchen Difficulty breathing  . Swelling of face and throat  . A fast heartbeat  . A bad rash all over body  . Dizziness and weakness   Immunizations Administered    Name Date Dose VIS Date Route   Moderna COVID-19 Vaccine 11/03/2019  9:03 AM 0.5 mL 05/2019 Intramuscular   Manufacturer: Gala Murdoch   Lot: [id number]   NDC: 86578-469-62      Covid-19 Vaccination Clinic  Name:  Samuel Clarke    MRN: [id number] DOB: 1960-11-07  11/03/2019  Mr. Samuel Clarke was observed post Covid-19 immunization for 15 minutes without incident. He was provided with Vaccine Information Sheet and instruction to access the V-Safe system.   Mr. Samuel Clarke was instructed to call 911 with any severe reactions post vaccine: Marland Kitchen Difficulty breathing  . Swelling of face and throat  . A fast heartbeat  . A bad rash all over body  . Dizziness and weakness   Immunizations Administered    Name Date Dose VIS Date Route   Moderna COVID-19 Vaccine 11/03/2019  9:03 AM 0.5 mL 05/2019 Intramuscular   Manufacturer: Moderna   Lot: [id number]   NDC: 53664-403-47

## 2020-02-03 ENCOUNTER — Ambulatory Visit: Admit: 2020-02-03 | Discharge: 2020-02-03 | Payer: PRIVATE HEALTH INSURANCE

## 2020-02-03 ENCOUNTER — Encounter: Admit: 2020-02-03 | Discharge: 2020-02-03 | Payer: PRIVATE HEALTH INSURANCE

## 2020-02-03 DIAGNOSIS — C49A2 Gastrointestinal stromal tumor of stomach: Principal | ICD-10-CM

## 2020-02-03 DIAGNOSIS — K439 Ventral hernia without obstruction or gangrene: Principal | ICD-10-CM

## 2020-06-03 DIAGNOSIS — C49A2 Gastrointestinal stromal tumor of stomach: Principal | ICD-10-CM

## 2020-07-20 DIAGNOSIS — C49A2 Gastrointestinal stromal tumor of stomach: Principal | ICD-10-CM

## 2020-08-05 DIAGNOSIS — C49A2 Gastrointestinal stromal tumor of stomach: Principal | ICD-10-CM

## 2020-08-10 ENCOUNTER — Encounter: Admit: 2020-08-10 | Discharge: 2020-08-11 | Payer: PRIVATE HEALTH INSURANCE

## 2020-08-10 ENCOUNTER — Ambulatory Visit: Admit: 2020-08-10 | Discharge: 2020-08-11 | Payer: PRIVATE HEALTH INSURANCE

## 2020-08-10 DIAGNOSIS — C49A2 Gastrointestinal stromal tumor of stomach: Principal | ICD-10-CM

## 2020-08-10 DIAGNOSIS — Z8509 Personal history of malignant neoplasm of other digestive organs: Principal | ICD-10-CM

## 2020-08-10 DIAGNOSIS — Z08 Encounter for follow-up examination after completed treatment for malignant neoplasm: Principal | ICD-10-CM

## 2021-02-07 ENCOUNTER — Ambulatory Visit
Admit: 2021-02-07 | Discharge: 2021-02-08 | Payer: PRIVATE HEALTH INSURANCE | Attending: Student in an Organized Health Care Education/Training Program | Primary: Student in an Organized Health Care Education/Training Program

## 2021-02-07 ENCOUNTER — Other Ambulatory Visit: Admit: 2021-02-07 | Discharge: 2021-02-08 | Payer: PRIVATE HEALTH INSURANCE

## 2021-02-07 ENCOUNTER — Ambulatory Visit: Admit: 2021-02-07 | Discharge: 2021-02-08 | Payer: PRIVATE HEALTH INSURANCE

## 2021-02-07 DIAGNOSIS — C49A2 Gastrointestinal stromal tumor of stomach: Principal | ICD-10-CM

## 2021-08-08 ENCOUNTER — Other Ambulatory Visit: Admit: 2021-08-08 | Discharge: 2021-08-09 | Payer: PRIVATE HEALTH INSURANCE

## 2021-08-08 ENCOUNTER — Ambulatory Visit
Admit: 2021-08-08 | Discharge: 2021-08-09 | Payer: PRIVATE HEALTH INSURANCE | Attending: Student in an Organized Health Care Education/Training Program | Primary: Student in an Organized Health Care Education/Training Program

## 2021-08-08 ENCOUNTER — Ambulatory Visit: Admit: 2021-08-08 | Discharge: 2021-08-09 | Payer: PRIVATE HEALTH INSURANCE

## 2021-08-08 DIAGNOSIS — C49A2 Gastrointestinal stromal tumor of stomach: Principal | ICD-10-CM

## 2022-02-06 ENCOUNTER — Ambulatory Visit: Admit: 2022-02-06 | Discharge: 2022-02-06 | Payer: PRIVATE HEALTH INSURANCE

## 2022-02-06 ENCOUNTER — Ambulatory Visit
Admit: 2022-02-06 | Discharge: 2022-02-06 | Payer: PRIVATE HEALTH INSURANCE | Attending: Student in an Organized Health Care Education/Training Program | Primary: Student in an Organized Health Care Education/Training Program

## 2022-02-06 DIAGNOSIS — C49A2 Gastrointestinal stromal tumor of stomach: Principal | ICD-10-CM

## 2022-07-31 ENCOUNTER — Ambulatory Visit (INDEPENDENT_AMBULATORY_CARE_PROVIDER_SITE_OTHER): Payer: 59

## 2022-07-31 ENCOUNTER — Encounter (HOSPITAL_COMMUNITY): Payer: Self-pay

## 2022-07-31 ENCOUNTER — Ambulatory Visit (HOSPITAL_COMMUNITY)
Admission: EM | Admit: 2022-07-31 | Discharge: 2022-07-31 | Disposition: A | Discharge status: Home / Self Care | Payer: 59 | Attending: Internal Medicine | Admitting: Internal Medicine

## 2022-07-31 DIAGNOSIS — L03031 Cellulitis of right toe: Secondary | ICD-10-CM | POA: Diagnosis not present

## 2022-07-31 DIAGNOSIS — R030 Elevated blood-pressure reading, without diagnosis of hypertension: Secondary | ICD-10-CM | POA: Diagnosis not present

## 2022-07-31 DIAGNOSIS — M79674 Pain in right toe(s): Secondary | ICD-10-CM | POA: Diagnosis not present

## 2022-07-31 DIAGNOSIS — Z872 Personal history of diseases of the skin and subcutaneous tissue: Secondary | ICD-10-CM | POA: Diagnosis not present

## 2022-07-31 DIAGNOSIS — M7989 Other specified soft tissue disorders: Secondary | ICD-10-CM | POA: Diagnosis not present

## 2022-07-31 MED ORDER — DOXYCYCLINE MONOHYDRATE 100 MG PO CAPS: 100.0000 mg | ORAL_CAPSULE | Freq: Two times a day (BID) | ORAL | 0 refills | Status: AC

## 2022-07-31 NOTE — ED Triage Notes (Signed)
Patient reports that he had right 3rd toe pain 5 days ago. Patient states 2 days ago he began having swelling and pain to the area. Patient does not know of any significant injury.

## 2022-07-31 NOTE — ED Provider Notes (Signed)
[facility]    CSN: [id number] Arrival date & time: 07/31/22  0805      History   Chief Complaint Chief Complaint  Patient presents with   Toe Pain    HPI Samuel Clarke is a 62 y.o. male presents to urgent care today with complaints of right third toe pain.  Patient states he was working on a roof approximately 5 days ago and toe pressed against tip of boot and he felt odd sensation.  2 days ago he began noticing increased swelling, redness and discomfort to affected toe.  He denies any recent fever or chills, numbness, tingling or loss of sensation.  No history of gout.    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 Authorizing Provider  doxycycline (MONODOX) 100 MG capsule Take 1 capsule (100 mg total) by mouth 2 (two) times daily for 7 days. 07/31/22 08/07/22 Yes Rudolpho Sevin, NP    Family History Family History  Problem Relation Age of Onset   Diabetes Mellitus II  Mother    Transient ischemic attack Father    Diabetes Mellitus II Sister     Social History Social History   Tobacco Use   Smoking status: Never   Smokeless tobacco: Never  Vaping Use   Vaping Use: Never used  Substance Use Topics   Alcohol use: Yes    Alcohol/week: 21.0 standard drinks of alcohol    Types: 21 Cans of beer per week   Drug use: No     Allergies   Patient has no known allergies.   Review of Systems As stated in HPI otherwise negative   Physical Exam Triage Vital Signs ED Triage Vitals  Enc Vitals Group     BP 07/31/22 0902 (!) 192/106     Pulse Rate 07/31/22 0902 100     Resp 07/31/22 0902 16     Temp 07/31/22 0902 98.6 F (37 C)     Temp Source 07/31/22 0902 Oral     SpO2 07/31/22 0902 (!) 16 %     Weight --      Height --      Head Circumference --      Peak Flow --      Pain Score 07/31/22 0903 4     Pain Loc --      Pain Edu? --  Excl. in [place]? --    No data found.  Updated Vital Signs BP (!) 151/85 (BP Location: Left Arm)   Pulse 100   Temp 98.6 F (37 C) (Oral)   Resp 18   SpO2 96%   Visual Acuity Right Eye Distance:   Left Eye Distance:   Bilateral Distance:    Right Eye Near:   Left Eye Near:    Bilateral Near:     Physical Exam Constitutional:      General: He is not in acute distress.    Appearance: Normal appearance. He is not ill-appearing or toxic-appearing.  Skin:    General: Skin is warm and dry.     Comments: With significant swelling and erythema to dorsal aspect of right third toe.  No evidence of trauma or foreign body.  Normal range of motion.  Neurological:     General: No focal deficit present.     Mental Status: He is alert and oriented to person, place, and time.  Psychiatric:        Mood and Affect: Mood normal.        Behavior: Behavior normal.      UC Treatments / Results  Labs (all labs ordered are listed, but only abnormal results are displayed) Labs Reviewed - No data to  display  EKG   Radiology DG Toe 3rd Right  Result Date: 07/31/2022 CLINICAL DATA:  Swelling, erythema EXAM: RIGHT THIRD TOE three views COMPARISON:  None Available. FINDINGS: Soft tissue swelling identified distally. No acute fracture, dislocation subluxation. No gross bony destructive process identified. No osteolytic or osteoblastic changes. IMPRESSION: Soft tissue swelling.  No osseous abnormalities identified. Electronically Signed   By: Sammie Bench M.D.   On: 07/31/2022 09:37    Procedures Procedures (including critical care time)  Medications Ordered in UC Medications - No data to display  Initial Impression / Assessment and Plan / UC Course  I have reviewed the triage vital signs and the nursing notes.  Pertinent labs & imaging results that were available during my care of the patient were reviewed by me and considered in my medical decision making (see chart for details).  Cellulitis, right third toe Elevated blood pressure reading -Exam consistent with infectious etiology.  Low suspicion for gout given insidious onset, patient is not exquisitely tender and no history of gout -No evidence of osteomyelitis on plain film.  No recent fever or chills -Doxy twice daily x 7 days with strict return precautions discussed if symptoms are not improved on day 4 or 5 of antibiotics. -To [facility] given  In regards to elevated blood pressure reading much improved upon recheck.  Patient denies any history of hypertension.  He is asked to follow-up with PCP to determine need for further management  Reviewed expections re: course of current medical issues. Questions answered. Outlined signs and symptoms indicating need for more acute intervention. Pt verbalized understanding. AVS given  Final Clinical Impressions(s) / UC Diagnoses   Final diagnoses:  Cellulitis of toe of right foot  Elevated blood pressure reading     Discharge Instructions      I am treating you  for an infection in your third toe.  X-ray does not show any evidence of bone involvement.  Please begin taking antibiotics today and complete entire course.  I have given you the referral number to [facility].  Please reach out to them for any persistent symptoms or follow-up sooner should you develop any  worsening pain, swelling, fever or chills.  Your repeat blood pressure was much better.  Please follow-up with your primary care provider for recheck.     ED Prescriptions     Medication Sig Dispense Auth. Provider   doxycycline (MONODOX) 100 MG capsule Take 1 capsule (100 mg total) by mouth 2 (two) times daily for 7 days. 14 capsule Rudolpho Sevin, NP      PDMP not reviewed this encounter.   Rudolpho Sevin, NP 07/31/22 1125

## 2022-07-31 NOTE — Discharge Instructions (Addendum)
I am treating you for an infection in your third toe.  X-ray does not show any evidence of bone involvement.  Please begin taking antibiotics today and complete entire course.  I have given you the referral number to [facility].  Please reach out to them for any persistent symptoms or follow-up sooner should you develop any worsening pain, swelling, fever or chills.  Your repeat blood pressure was much better.  Please follow-up with your primary care provider for recheck.

## 2022-08-21 ENCOUNTER — Encounter (HOSPITAL_COMMUNITY): Payer: Self-pay | Admitting: *Deleted

## 2022-08-21 ENCOUNTER — Ambulatory Visit (HOSPITAL_COMMUNITY)
Admission: EM | Admit: 2022-08-21 | Discharge: 2022-08-21 | Disposition: A | Discharge status: Home / Self Care | Payer: 59

## 2022-08-21 DIAGNOSIS — M7989 Other specified soft tissue disorders: Secondary | ICD-10-CM | POA: Diagnosis not present

## 2022-08-21 DIAGNOSIS — M79674 Pain in right toe(s): Secondary | ICD-10-CM | POA: Diagnosis not present

## 2022-08-21 NOTE — ED Provider Notes (Signed)
[facility]    CSN: [id number] Arrival date & time: 08/21/22  [id number]      History   Chief Complaint Chief Complaint  Patient presents with   Toe Pain    HPI Samuel Clarke is a 62 y.o. male.   Patient presents urgent care for evaluation of right third toe swelling and pain that initially started on July 26, 2022 where the toe became very red, swollen, and tender.  He was seen in urgent care on July 31, 2022 where he was found to have a significant cellulitis to the right third toe without known trauma or injury to the toe.  X-ray was performed and was negative for acute bony abnormality but did show soft tissue swelling.  He was placed on doxycycline antibiotic twice daily for 7 days and patient took this antibiotic as prescribed in full.  He states that this helped to reduce the swelling and the redness to the right third toe, however a few days ago he experienced significant pain to the affected digit when he dropped a nickel on the toe.  This caused him great concern for further inflammation and infection.  He is not a diabetic and denies numbness and tingling to the distal affected digit.  No changes to the nail.  He states that the swelling to the right third toe has improved significantly over the last 3 weeks and his pain to the toe is currently a 1 or a 2 on a scale of 0-10.  He states that he continues to wear steel toed shoes so as to prevent injury and trauma to the toe.  He was advised to follow-up with a podiatrist but never went to the specialist office and return to urgent care today to ensure that the toe does not appear infected anymore.  No history of peripheral artery disease.     Past Medical History:  Diagnosis Date   Alcohol use    Gastric mass    /notes 02/04/2017   Heart palpitations 06/2016   Normocytic anemia 01/2017   Obesity 06/2016   Patient is Jehovah's Witness    Pleurisy ~ 2008   Refusal of blood transfusions as patient is Jehovah's  Witness    Rosacea     Patient Active Problem List   Diagnosis Date Noted   Hyponatremia 02/04/2017   Normocytic anemia 02/04/2017   Leukocytosis 02/04/2017   Alcohol use 02/04/2017   Patient is Jehovah's Witness 02/04/2017   Gastric mass 02/04/2017   Chest pain 02/04/2017   Abdominal pain 02/04/2017   Abnormal CT scan, stomach     Past Surgical History:  Procedure Laterality Date   ABDOMINAL SURGERY     ESOPHAGOGASTRODUODENOSCOPY     ESOPHAGOGASTRODUODENOSCOPY N/A 02/04/2017   Procedure: ESOPHAGOGASTRODUODENOSCOPY (EGD);  Surgeon: Ladene Artist, MD;  Location: [facility] ENDOSCOPY;  Service: Endoscopy;  Laterality: N/A;   TONSILLECTOMY     At age 15 or 7       Home Medications    Prior to Admission medications   Not on File    Family History Family History  Problem Relation Age of Onset   Diabetes Mellitus II Mother    Transient ischemic attack Father    Diabetes Mellitus II Sister     Social History Social History   Tobacco Use   Smoking status: Never   Smokeless tobacco: Never  Vaping Use   Vaping Use: Never used  Substance Use Topics   Alcohol use: Yes  Alcohol/week: 21.0 standard drinks of alcohol    Types: 21 Cans of beer per week   Drug use: No     Allergies   Patient has no known allergies.   Review of Systems Review of Systems Per HPI  Physical Exam Triage Vital Signs ED Triage Vitals  Enc Vitals Group     BP 08/21/22 0842 (!) 152/88     Pulse Rate 08/21/22 0842 92     Resp 08/21/22 0842 20     Temp 08/21/22 0842 98.4 F (36.9 C)     Temp Source 08/21/22 0842 Oral     SpO2 08/21/22 0842 98 %     Weight --      Height --      Head Circumference --      Peak Flow --      Pain Score 08/21/22 0841 5     Pain Loc --      Pain Edu? --      Excl. in [place]? --    No data found.  Updated Vital Signs BP (!) 152/88 (BP Location: Left Arm)   Pulse 92   Temp 98.4 F (36.9 C) (Oral)   Resp 20   SpO2 98%   Visual Acuity Right Eye  Distance:   Left Eye Distance:   Bilateral Distance:    Right Eye Near:   Left Eye Near:    Bilateral Near:     Physical Exam Vitals and nursing note reviewed.  Constitutional:      Appearance: He is not ill-appearing or toxic-appearing.  HENT:     Head: Normocephalic and atraumatic.     Right Ear: Hearing and external ear normal.     Left Ear: Hearing and external ear normal.     Nose: Nose normal.     Mouth/Throat:     Lips: Pink.  Eyes:     General: Lids are normal. Vision grossly intact. Gaze aligned appropriately.     Extraocular Movements: Extraocular movements intact.     Conjunctiva/sclera: Conjunctivae normal.  Cardiovascular:     Rate and Rhythm: Normal rate and regular rhythm.     Pulses:          Dorsalis pedis pulses are 2+ on the right side and 2+ on the left side.       Posterior tibial pulses are 2+ on the right side and 2+ on the left side.     Heart sounds: Normal heart sounds, S1 normal and S2 normal.  Pulmonary:     Effort: Pulmonary effort is normal. No respiratory distress.     Breath sounds: Normal breath sounds and air entry.  Musculoskeletal:     Cervical back: Neck supple.  Feet:     Right foot:     Skin integrity: Skin integrity normal.     Left foot:     Skin integrity: Skin integrity normal.     Toenail Condition: Left toenails are normal.     Comments: Right third toe: No redness or warmth.  Diffuse soft tissue swelling is noted, see image below for further detail.  Capillary refill to affected digit is less than 3, sensation and strength intact to affected digit.  Ambulatory with steady gait without limp.  No breaks in the skin or abrasions/lacerations.  No drainage. Skin:    General: Skin is warm and dry.     Capillary Refill: Capillary refill takes less than 2 seconds.     Findings: No rash.  Neurological:  General: No focal deficit present.     Mental Status: He is alert and oriented to person, place, and time. Mental status is at  baseline.     Cranial Nerves: No dysarthria or facial asymmetry.  Psychiatric:        Mood and Affect: Mood normal.        Speech: Speech normal.        Behavior: Behavior normal.        Thought Content: Thought content normal.        Judgment: Judgment normal.    Photo from patient's phone from initial infection    Photo taken today in clinic    UC Treatments / Results  Labs (all labs ordered are listed, but only abnormal results are displayed) Labs Reviewed - No data to display  EKG   Radiology No results found.  Procedures Procedures (including critical care time)  Medications Ordered in UC Medications - No data to display  Initial Impression / Assessment and Plan / UC Course  I have reviewed the triage vital signs and the nursing notes.  Pertinent labs & imaging results that were available during my care of the patient were reviewed by me and considered in my medical decision making (see chart for details).   1.  Pain and swelling of the toe of right foot Exam findings in clinic are stable and without signs of infection, therefore will defer further antibiotic use as it is not indicated today.  He is neurovascularly intact to distal aspect of the right third toe and swelling and redness have improved significantly based on evidence and photo provided by patient from previous exam prior to antibiotic.  Advised to follow-up with podiatry as needed for ongoing evaluation.  RICE advised.  Reviewed x-ray from last visit of the affected digit, no need to repeat this today as he has had significant improvement in symptoms and exam.  Patient is agreeable with this plan.  Advised to continue wearing steel toed shoes to protect the toe and return to urgent care if he experiences any new or worsening signs of infection, worsening pain, or any new numbness or tingling to the toe.   Discussed physical exam and available lab work findings in clinic with patient.  Counseled patient  regarding appropriate use of medications and potential side effects for all medications recommended or prescribed today. Discussed red flag signs and symptoms of worsening condition,when to call the PCP office, return to urgent care, and when to seek higher level of care in the emergency department. Patient verbalizes understanding and agreement with plan. All questions answered. Patient discharged in stable condition.    Final Clinical Impressions(s) / UC Diagnoses   Final diagnoses:  Pain and swelling of toe of right foot     Discharge Instructions      Your exam looks great today and is very reassuring.  No need for additional antibiotics today.  Return to urgent care if your toe becomes more swollen, red, or painful.   Follow-up with [facility] as needed for ongoing evaluation.   Rest and apply ice to the toe 20 minutes on 20 minutes off to reduce further inflammation.  Please elevate your right foot after the end of the day and is much as possible throughout the day to reduce swelling and inflammation to the toe.  Return to urgent care as needed.    ED Prescriptions   None    PDMP not reviewed this encounter.  Joella Prince [place], [place] 08/21/22 [phone]

## 2022-08-21 NOTE — ED Triage Notes (Signed)
Pt states his 3rd toe on the right is still infected, he states the antibiotics he was given last night helped but it hasn't resolved all the way. He did get a referral but he can't afford to go to podiatry.

## 2022-08-21 NOTE — Discharge Instructions (Signed)
Your exam looks great today and is very reassuring.  No need for additional antibiotics today.  Return to urgent care if your toe becomes more swollen, red, or painful.   Follow-up with [facility] as needed for ongoing evaluation.   Rest and apply ice to the toe 20 minutes on 20 minutes off to reduce further inflammation.  Please elevate your right foot after the end of the day and is much as possible throughout the day to reduce swelling and inflammation to the toe.  Return to urgent care as needed.

## 2022-10-03 ENCOUNTER — Ambulatory Visit (INDEPENDENT_AMBULATORY_CARE_PROVIDER_SITE_OTHER): Payer: 59

## 2022-10-03 ENCOUNTER — Ambulatory Visit: Payer: 59 | Admitting: Podiatry

## 2022-10-03 DIAGNOSIS — L03031 Cellulitis of right toe: Secondary | ICD-10-CM | POA: Diagnosis not present

## 2022-10-03 DIAGNOSIS — L02611 Cutaneous abscess of right foot: Secondary | ICD-10-CM

## 2022-10-03 DIAGNOSIS — M10471 Other secondary gout, right ankle and foot: Secondary | ICD-10-CM | POA: Diagnosis not present

## 2022-10-03 MED ORDER — COLCHICINE 0.6 MG PO TABS: ORAL_TABLET | ORAL | 2 refills | Status: DC

## 2022-10-04 LAB — CBC WITH DIFFERENTIAL/PLATELET
Basophils Absolute: 0 10*3/uL (ref 0.0–0.2)
Basos: 1 %
EOS (ABSOLUTE): 0.3 10*3/uL (ref 0.0–0.4)
Eos: 4 %
Hematocrit: 40.8 % (ref 37.5–51.0)
Hemoglobin: 13.1 g/dL (ref 13.0–17.7)
Immature Grans (Abs): 0 10*3/uL (ref 0.0–0.1)
Immature Granulocytes: 0 %
Lymphocytes Absolute: 3.2 10*3/uL — ABNORMAL HIGH (ref 0.7–3.1)
Lymphs: 42 %
MCH: 28.6 pg (ref 26.6–33.0)
MCHC: 32.1 g/dL (ref 31.5–35.7)
MCV: 89 fL (ref 79–97)
Monocytes Absolute: 0.9 10*3/uL (ref 0.1–0.9)
Monocytes: 11 %
Neutrophils Absolute: 3.2 10*3/uL (ref 1.4–7.0)
Neutrophils: 42 %
Platelets: 267 10*3/uL (ref 150–450)
RBC: 4.58 x10E6/uL (ref 4.14–5.80)
RDW: 14.6 % (ref 11.6–15.4)
WBC: 7.6 10*3/uL (ref 3.4–10.8)

## 2022-10-04 LAB — BASIC METABOLIC PANEL
BUN/Creatinine Ratio: 16 (ref 10–24)
BUN: 12 mg/dL (ref 8–27)
CO2: 22 mmol/L (ref 20–29)
Calcium: 9.7 mg/dL (ref 8.6–10.2)
Chloride: 101 mmol/L (ref 96–106)
Creatinine, Ser: 0.77 mg/dL (ref 0.76–1.27)
Glucose: 73 mg/dL (ref 70–99)
Potassium: 5.3 mmol/L — ABNORMAL HIGH (ref 3.5–5.2)
Sodium: 140 mmol/L (ref 134–144)
eGFR: 101 mL/min/{1.73_m2} (ref >59)

## 2022-10-04 LAB — URIC ACID: Uric Acid: 5.9 mg/dL (ref 3.8–8.4)

## 2022-10-04 LAB — SEDIMENTATION RATE: Sed Rate: 10 mm/hr (ref 0–30)

## 2022-10-07 ENCOUNTER — Encounter: Payer: Self-pay | Admitting: Podiatry

## 2022-10-07 NOTE — Progress Notes (Signed)
Subjective:  Patient ID: Samuel Clarke, male    DOB: [birth year],  MRN: [id number]  Chief Complaint  Patient presents with   Toe Pain    np right 3rd toe hx of cellulitis having severe pain at tip of toe    62 y.o. male presents with the above complaint. History confirmed with patient.  Has had multiple rounds of antibiotics at this point to remain swollen and tender  Objective:  Physical Exam: warm, good capillary refill, no trophic changes or ulcerative lesions, normal DP and PT pulses, normal sensory exam, and right third toe DIPJ erythematous tender and swollen   Radiographs: Multiple views x-ray of the left foot: no fracture, dislocation, swelling or degenerative changes noted and digital contractures Assessment:   1. Acute gout due to other secondary cause involving toe of right foot   2. Cellulitis and abscess of toe of right foot      Plan:  Patient was evaluated and treated and all questions answered.  We discussed likely hood of this being a gouty flare.  I recommended checking lab work and ESR CBC BMP and uric acid were ordered.  Do not think further indications for antibiotics are necessary, there is no open wound ulceration or area that could be the source of the infection and his nail does not show any signs of ingrowing or abscess formation.  I will follow-up with him as needed his lab work shows and prescribed him to see me, use and possible side effects reviewed.  No follow-ups on file.

## 2023-04-01 ENCOUNTER — Encounter (HOSPITAL_COMMUNITY): Payer: Self-pay

## 2023-04-01 ENCOUNTER — Inpatient Hospital Stay (HOSPITAL_COMMUNITY)
Admission: EM | Admit: 2023-04-01 | Discharge: 2023-04-03 | DRG: 061 | Disposition: A | Discharge status: Home / Self Care | Payer: 59 | Attending: Internal Medicine | Admitting: Internal Medicine

## 2023-04-01 ENCOUNTER — Other Ambulatory Visit: Payer: Self-pay

## 2023-04-01 ENCOUNTER — Emergency Department (HOSPITAL_COMMUNITY): Payer: 59

## 2023-04-01 ENCOUNTER — Inpatient Hospital Stay (HOSPITAL_COMMUNITY): Payer: 59

## 2023-04-01 DIAGNOSIS — R29703 NIHSS score 3: Secondary | ICD-10-CM | POA: Diagnosis present

## 2023-04-01 DIAGNOSIS — I612 Nontraumatic intracerebral hemorrhage in hemisphere, unspecified: Secondary | ICD-10-CM | POA: Diagnosis not present

## 2023-04-01 DIAGNOSIS — G8324 Monoplegia of upper limb affecting left nondominant side: Secondary | ICD-10-CM | POA: Diagnosis not present

## 2023-04-01 DIAGNOSIS — I4891 Unspecified atrial fibrillation: Secondary | ICD-10-CM | POA: Diagnosis present

## 2023-04-01 DIAGNOSIS — Z6831 Body mass index (BMI) 31.0-31.9, adult: Secondary | ICD-10-CM

## 2023-04-01 DIAGNOSIS — R29898 Other symptoms and signs involving the musculoskeletal system: Principal | ICD-10-CM

## 2023-04-01 DIAGNOSIS — Z833 Family history of diabetes mellitus: Secondary | ICD-10-CM

## 2023-04-01 DIAGNOSIS — D4819 Other specified neoplasm of uncertain behavior of connective and other soft tissue: Secondary | ICD-10-CM | POA: Diagnosis not present

## 2023-04-01 DIAGNOSIS — Z8673 Personal history of transient ischemic attack (TIA), and cerebral infarction without residual deficits: Secondary | ICD-10-CM

## 2023-04-01 DIAGNOSIS — R29818 Other symptoms and signs involving the nervous system: Secondary | ICD-10-CM | POA: Diagnosis not present

## 2023-04-01 DIAGNOSIS — R270 Ataxia, unspecified: Secondary | ICD-10-CM | POA: Diagnosis not present

## 2023-04-01 DIAGNOSIS — I672 Cerebral atherosclerosis: Secondary | ICD-10-CM | POA: Diagnosis not present

## 2023-04-01 DIAGNOSIS — E785 Hyperlipidemia, unspecified: Secondary | ICD-10-CM | POA: Diagnosis present

## 2023-04-01 DIAGNOSIS — T45615A Adverse effect of thrombolytic drugs, initial encounter: Secondary | ICD-10-CM | POA: Diagnosis not present

## 2023-04-01 DIAGNOSIS — Z7902 Long term (current) use of antithrombotics/antiplatelets: Secondary | ICD-10-CM | POA: Diagnosis not present

## 2023-04-01 DIAGNOSIS — I10 Essential (primary) hypertension: Secondary | ICD-10-CM | POA: Diagnosis not present

## 2023-04-01 DIAGNOSIS — Z23 Encounter for immunization: Secondary | ICD-10-CM

## 2023-04-01 DIAGNOSIS — I639 Cerebral infarction, unspecified: Secondary | ICD-10-CM | POA: Diagnosis not present

## 2023-04-01 DIAGNOSIS — I6389 Other cerebral infarction: Secondary | ICD-10-CM

## 2023-04-01 DIAGNOSIS — E1165 Type 2 diabetes mellitus with hyperglycemia: Secondary | ICD-10-CM | POA: Diagnosis not present

## 2023-04-01 DIAGNOSIS — E119 Type 2 diabetes mellitus without complications: Secondary | ICD-10-CM

## 2023-04-01 DIAGNOSIS — E669 Obesity, unspecified: Secondary | ICD-10-CM | POA: Diagnosis not present

## 2023-04-01 DIAGNOSIS — I6340 Cerebral infarction due to embolism of unspecified cerebral artery: Principal | ICD-10-CM | POA: Diagnosis present

## 2023-04-01 DIAGNOSIS — I6523 Occlusion and stenosis of bilateral carotid arteries: Secondary | ICD-10-CM | POA: Diagnosis not present

## 2023-04-01 DIAGNOSIS — I4819 Other persistent atrial fibrillation: Secondary | ICD-10-CM | POA: Diagnosis present

## 2023-04-01 DIAGNOSIS — Z79899 Other long term (current) drug therapy: Secondary | ICD-10-CM

## 2023-04-01 LAB — CBC
HCT: 39.8 % (ref 39.0–52.0)
Hemoglobin: 13.3 g/dL (ref 13.0–17.0)
MCH: 29.1 pg (ref 26.0–34.0)
MCHC: 33.4 g/dL (ref 30.0–36.0)
MCV: 87.1 fL (ref 80.0–100.0)
Platelets: 281 10*3/uL (ref 150–400)
RBC: 4.57 MIL/uL (ref 4.22–5.81)
RDW: 15.5 % (ref 11.5–15.5)
WBC: 8.4 10*3/uL (ref 4.0–10.5)
nRBC: 0 % (ref 0.0–0.2)

## 2023-04-01 LAB — CBG MONITORING, ED: Glucose-Capillary: 141 mg/dL — ABNORMAL HIGH (ref 70–99)

## 2023-04-01 LAB — URINALYSIS, ROUTINE W REFLEX MICROSCOPIC
Bilirubin Urine: NEGATIVE
Glucose, UA: NEGATIVE mg/dL
Hgb urine dipstick: NEGATIVE
Ketones, ur: NEGATIVE mg/dL
Leukocytes,Ua: NEGATIVE
Nitrite: NEGATIVE
Protein, ur: NEGATIVE mg/dL
Specific Gravity, Urine: 1.012 (ref 1.005–1.030)
pH: 6 (ref 5.0–8.0)

## 2023-04-01 LAB — PROTIME-INR
INR: 0.9 (ref 0.8–1.2)
Prothrombin Time: 12.6 s (ref 11.4–15.2)

## 2023-04-01 LAB — COMPREHENSIVE METABOLIC PANEL
ALT: 36 U/L (ref 0–44)
AST: 37 U/L (ref 15–41)
Albumin: 3.7 g/dL (ref 3.5–5.0)
Alkaline Phosphatase: 62 U/L (ref 38–126)
Anion gap: 16 — ABNORMAL HIGH (ref 5–15)
BUN: 9 mg/dL (ref 8–23)
CO2: 22 mmol/L (ref 22–32)
Calcium: 8.7 mg/dL — ABNORMAL LOW (ref 8.9–10.3)
Chloride: 97 mmol/L — ABNORMAL LOW (ref 98–111)
Creatinine, Ser: 0.84 mg/dL (ref 0.61–1.24)
GFR, Estimated: >60 mL/min (ref >60)
Glucose, Bld: 148 mg/dL — ABNORMAL HIGH (ref 70–99)
Potassium: 4.2 mmol/L (ref 3.5–5.1)
Sodium: 135 mmol/L (ref 135–145)
Total Bilirubin: 0.4 mg/dL (ref 0.3–1.2)
Total Protein: 7.1 g/dL (ref 6.5–8.1)

## 2023-04-01 LAB — I-STAT CHEM 8, ED
BUN: 9 mg/dL (ref 8–23)
Calcium, Ion: 1.05 mmol/L — ABNORMAL LOW (ref 1.15–1.40)
Chloride: 101 mmol/L (ref 98–111)
Creatinine, Ser: 0.8 mg/dL (ref 0.61–1.24)
Glucose, Bld: 146 mg/dL — ABNORMAL HIGH (ref 70–99)
HCT: 45 % (ref 39.0–52.0)
Hemoglobin: 15.3 g/dL (ref 13.0–17.0)
Potassium: 4.2 mmol/L (ref 3.5–5.1)
Sodium: 136 mmol/L (ref 135–145)
TCO2: 24 mmol/L (ref 22–32)

## 2023-04-01 LAB — DIFFERENTIAL
Abs Immature Granulocytes: 0.02 10*3/uL (ref 0.00–0.07)
Basophils Absolute: 0.1 10*3/uL (ref 0.0–0.1)
Basophils Relative: 1 %
Eosinophils Absolute: 0.3 10*3/uL (ref 0.0–0.5)
Eosinophils Relative: 4 %
Immature Granulocytes: 0 %
Lymphocytes Relative: 43 %
Lymphs Abs: 3.6 10*3/uL (ref 0.7–4.0)
Monocytes Absolute: 0.9 10*3/uL (ref 0.1–1.0)
Monocytes Relative: 11 %
Neutro Abs: 3.4 10*3/uL (ref 1.7–7.7)
Neutrophils Relative %: 41 %

## 2023-04-01 LAB — APTT: aPTT: 26 s (ref 24–36)

## 2023-04-01 LAB — MRSA NEXT GEN BY PCR, NASAL: MRSA by PCR Next Gen: NOT DETECTED

## 2023-04-01 LAB — RAPID URINE DRUG SCREEN, HOSP PERFORMED
Amphetamines: NOT DETECTED
Barbiturates: NOT DETECTED
Benzodiazepines: NOT DETECTED
Cocaine: NOT DETECTED
Opiates: NOT DETECTED
Tetrahydrocannabinol: NOT DETECTED

## 2023-04-01 LAB — ECHOCARDIOGRAM COMPLETE
Height: 70 in
S' Lateral: 3 cm
Weight: 3492.09 [oz_av]

## 2023-04-01 LAB — HIV ANTIBODY (ROUTINE TESTING W REFLEX): HIV Screen 4th Generation wRfx: NONREACTIVE

## 2023-04-01 LAB — ETHANOL: Alcohol, Ethyl (B): <10 mg/dL (ref <10)

## 2023-04-01 LAB — HEMOGLOBIN A1C
Hgb A1c MFr Bld: 7.1 % — ABNORMAL HIGH (ref 4.8–5.6)
Mean Plasma Glucose: 157.07 mg/dL

## 2023-04-01 LAB — GLUCOSE, CAPILLARY: Glucose-Capillary: 143 mg/dL — ABNORMAL HIGH (ref 70–99)

## 2023-04-01 MED ORDER — ACETAMINOPHEN 160 MG/5ML PO SOLN: 650.0000 mg | ORAL | Status: DC | PRN

## 2023-04-01 MED ORDER — TENECTEPLASE FOR STROKE
0.2500 mg/kg | PACK | Freq: Once | INTRAVENOUS | Status: AC
  Administered 2023-04-01: 25 mg via INTRAVENOUS
  Filled 2023-04-01: qty 10

## 2023-04-01 MED ORDER — ACETAMINOPHEN 325 MG PO TABS: 650.0000 mg | ORAL_TABLET | ORAL | Status: DC | PRN

## 2023-04-01 MED ORDER — STROKE: EARLY STAGES OF RECOVERY BOOK
Freq: Once | Status: AC
  Filled 2023-04-01 (×2): qty 1

## 2023-04-01 MED ORDER — IOHEXOL 350 MG/ML SOLN
75.0000 mL | Freq: Once | INTRAVENOUS | Status: AC | PRN
  Administered 2023-04-01: 75 mL via INTRAVENOUS

## 2023-04-01 MED ORDER — CLEVIDIPINE BUTYRATE 0.5 MG/ML IV EMUL
0.0000 mg/h | INTRAVENOUS | Status: DC
  Administered 2023-04-01: 2 mg/h via INTRAVENOUS
  Administered 2023-04-01: 4 mg/h via INTRAVENOUS
  Filled 2023-04-01: qty 100

## 2023-04-01 MED ORDER — SENNOSIDES-DOCUSATE SODIUM 8.6-50 MG PO TABS: 1.0000 | ORAL_TABLET | Freq: Every evening | ORAL | Status: DC | PRN

## 2023-04-01 MED ORDER — ACETAMINOPHEN 650 MG RE SUPP: 650.0000 mg | RECTAL | Status: DC | PRN

## 2023-04-01 MED ORDER — CLEVIDIPINE BUTYRATE 0.5 MG/ML IV EMUL
INTRAVENOUS | Status: AC
  Filled 2023-04-01: qty 50

## 2023-04-01 MED ORDER — LABETALOL HCL 5 MG/ML IV SOLN
10.0000 mg | Freq: Once | INTRAVENOUS | Status: AC
  Administered 2023-04-01: 10 mg via INTRAVENOUS

## 2023-04-01 MED ORDER — PANTOPRAZOLE SODIUM 40 MG IV SOLR
40.0000 mg | Freq: Every day | INTRAVENOUS | Status: DC
  Administered 2023-04-01 – 2023-04-02 (×2): 40 mg via INTRAVENOUS
  Filled 2023-04-01 (×2): qty 10

## 2023-04-01 MED ORDER — SODIUM CHLORIDE 0.9 % IV SOLN: INTRAVENOUS | Status: DC

## 2023-04-01 NOTE — H&P (Signed)
Stroke Neurology Admission History & Physical   CC: left arm weakness and numbness  History is obtained from: patient and chart  HPI: Samuel Clarke is a 62 y.o. 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  He said he was leaning on the wall with support from his left hand when it felt like something in the lines of when funny bone inside.  Initially started with tingling in the left hand became numb and difficult to operate.  His wife drove him to the emergency department.  He was seen at triage and a code stroke was activated after the MD examined him and found ataxia on examination He was seen and evaluated in the CT scanner.  Of note-Jehovah's Witness-would not accept blood products.  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  LKW: 5 AM today IV thrombolysis given?:  Yes EVT:no - no ELVO Premorbid modified Rankin scale (mRS): 0   ROS: Full ROS was performed and is negative except as noted in the HPI.   Past Medical History:  Diagnosis Date   Alcohol use    Gastric mass    /notes 02/04/2017   Heart palpitations 06/2016   Normocytic anemia 01/2017   Obesity 06/2016   Patient is Jehovah's Witness    Pleurisy ~ 2008   Refusal of blood transfusions as patient is Jehovah's Witness    Rosacea     Family History  Problem Relation Age of Onset   Diabetes Mellitus II Mother    Transient ischemic attack Father    Diabetes Mellitus II Sister    Social History:   reports that he has never smoked. He has never used smokeless tobacco. He reports current alcohol use of about 21.0 standard drinks of alcohol per week. He reports that he does not use drugs.  Medications  Current Facility-Administered  Medications:    clevidipine (CLEVIPREX) infusion 0.5 mg/mL, 0-21 mg/hr, Intravenous, Continuous, Milon Dikes, MD   tenecteplase [facility]) injection for Stroke 25 mg, 0.25 mg/kg, Intravenous, Once, Milon Dikes, MD  Current Outpatient Medications:    colchicine 0.6 MG tablet, Take 1.2mg  (2 tablets) then 0.6mg  (1 tablet) 1 hour after. Then, take 1 tablet every day for 7 days., Disp: 10 tablet, Rfl: 2   Exam: Current vital signs: BP (!) 187/107 (BP Location: Right Arm)   Pulse 98   Temp 98.4 F (36.9 C) (Oral)   Resp 18   Ht 5\' 10"  (1.778 m)   Wt 99 kg   SpO2 100%   BMI 31.32 kg/m  Vital signs in last 24 hours: Temp:  [98.4 F (36.9 C)] 98.4 F (36.9 C) (10/07 0624) Pulse Rate:  [98] 98 (10/07 0624) Resp:  [18] 18 (10/07 0624) BP: (187)/(107) 187/107 (10/07 0624) SpO2:  [100 %] 100 % (10/07 0624) Weight:  [99 kg] 99 kg (10/07 1610) GENERAL: Awake, alert in NAD HEENT: - Normocephalic and atraumatic, dry mm, no LN++, no Thyromegally LUNGS - Clear to auscultation bilaterally with no wheezes CV - S1S2 RRR, no m/r/g, equal pulses bilaterally. ABDOMEN - Soft, nontender, nondistended with normoactive BS Ext: warm, well perfused, intact peripheral pulses, no  NEURO:  Mental Status: AA&Ox3  Language: speech is nondysarthric.  Naming, repetition, fluency, and comprehension  intact. Cranial Nerves: PERRL EOMI, visual fields full, no facial asymmetry, facial sensation intact, hearing intact, tongue/uvula/soft palate midline, normal sternocleidomastoid and trapezius muscle strength. No evidence of tongue atrophy or fibrillations Motor: No drift in any of the 4 extremities. Tone: is normal and bulk is normal Sensation-diminished to light touch in the left hand.  Otherwise intact. Coordination: FTN severely ataxic on the left.  All other limbs intact. Gait- deferred  NIHSS-2  Labs I have reviewed labs in epic and the results pertinent to this consultation are:  CBC    Component  Value Date/Time   WBC 8.4 04/01/2023 0639   RBC 4.57 04/01/2023 0639   HGB 15.3 04/01/2023 0639   HGB 13.3 04/01/2023 0639   HGB 13.1 10/03/2022 1449   HCT 45.0 04/01/2023 0639   HCT 39.8 04/01/2023 0639   HCT 40.8 10/03/2022 1449   PLT 281 04/01/2023 0639   PLT 267 10/03/2022 1449   MCV 87.1 04/01/2023 0639   MCV 89 10/03/2022 1449   MCH 29.1 04/01/2023 0639   MCHC 33.4 04/01/2023 0639   RDW 15.5 04/01/2023 0639   RDW 14.6 10/03/2022 1449   LYMPHSABS 3.6 04/01/2023 0639   LYMPHSABS 3.2 (H) 10/03/2022 1449   MONOABS 0.9 04/01/2023 0639   EOSABS 0.3 04/01/2023 0639   EOSABS 0.3 10/03/2022 1449   BASOSABS 0.1 04/01/2023 0639   BASOSABS 0.0 10/03/2022 1449    CMP     Component Value Date/Time   NA 136 04/01/2023 0639   NA 140 10/03/2022 1449   K 4.2 04/01/2023 0639   CL 101 04/01/2023 0639   CO2 22 10/03/2022 1449   GLUCOSE 146 (H) 04/01/2023 0639   BUN 9 04/01/2023 0639   BUN 12 10/03/2022 1449   CREATININE 0.80 04/01/2023 0639   CALCIUM 9.7 10/03/2022 1449   PROT 6.6 02/04/2017 0213   ALBUMIN 3.8 02/04/2017 0213   AST 29 02/04/2017 0213   ALT 16 (L) 02/04/2017 0213   ALKPHOS 38 02/04/2017 0213   BILITOT 0.8 02/04/2017 0213   GFRNONAA >60 02/05/2017 0336   GFRAA >60 02/05/2017 0336    Imaging I have reviewed the images obtained:  CT-head-ASPECTS 10, no bleed  CTA head and neck - no ELVO.   Assessment:  62 year old with above medical history presented for sudden onset of left arm numbness and weakness.  On examination, low NIH but disabling symptoms due to left arm weakness. Discussed risks benefits and alternatives of IV TNKase and he agreed to proceed. Also discussed this with his wife outside the CT scanner room. Advised that they are Jehovah's Witness and do not accept blood products if needed. Likely small vessel etiology infarct   Plan:  Acute Ischemic Stroke Cerebral infarction due to embolism of other cerebral artery or posterior circ stroke due  to Cerebral infarction due to thrombosis of basilar artery  Acuity: Acute Current Suspected Etiology: As above Continue Evaluation:  -Admit to: Neuro ICU -Hold Aspirin until 24 hour post IV thrombolysis (tPA or TNKase) neuroimaging is stable and without evidence of bleeding -Blood pressure control, goal of SYS 180/105 or below -MRI/ECHO/A1C/Lipid panel. -Hyperglycemia management per SSI to maintain glucose 140-180mg /dL. -PT/OT/ST therapies and recommendations when able  CNS Close neuromonitoring PT OT N.p.o. until cleared by bedside swallow evaluation or formal swallow evaluation   RESP No active issues.  Monitor clinically  CV Came in hypertensive.  May need Cleviprex drip for above goal No history of hypertension Titrate drip as needed TTE Statin for goal LDL <  70  HEME No active issues Jehovah's Witness-does not accept blood products.  ENDO Mild hyperglycemia No history of diabetes Check A1c-goal less than 7   GI/GU No active issues Gentle hydration   Fluid/Electrolyte Disorders No electrolyte derangements Check BMP in the morning Replete as necessary  Prophylaxis DVT: S CDs GI: PPI Bowel: Docusate senna  Diet: NPO until cleared by speech/bedside swallow  Code Status: Full Code   Jehovah's Witness-no blood products   THE FOLLOWING WERE PRESENT ON ADMISSION: Acute ischemic stroke, hemiparesis, GIST Risks, benefits, alternatives of IV thrombolysis were discussed and family/patient agreed to proceed. CT imaging was reviewed personally prior to IV thrombolysis administration with no evidence of bleed.  Plan discussed with EDP Stroke team to follow -- Milon Dikes, MD Neurologist [facility] Pager: [phone]   CRITICAL CARE ATTESTATION Performed by: Milon Dikes, MD Total critical care time: 32 minutes Critical care time was exclusive of separately billable procedures and treating other patients and/or supervising  APPs/Residents/Students Critical care was necessary to treat or prevent imminent or life-threatening deterioration. This patient is critically ill and at significant risk for neurological worsening and/or death and care requires constant monitoring. Critical care was time spent personally by me on the following activities: development of treatment plan with patient and/or surrogate as well as nursing, discussions with consultants, evaluation of patient's response to treatment, examination of patient, obtaining history from patient or surrogate, ordering and performing treatments and interventions, ordering and review of laboratory studies, ordering and review of radiographic studies, pulse oximetry, re-evaluation of patient's condition, participation in multidisciplinary rounds and medical decision making of high complexity in the care of this patient.

## 2023-04-01 NOTE — Progress Notes (Signed)
OT Cancellation Note  Patient Details Name: Samuel Clarke MRN: [id number] DOB: [birth year]   Cancelled Treatment:    Reason Eval/Treat Not Completed: Active bedrest order (Strict bedrest that started at 7:00 monday 10/7). OT will continue to follow acutely for evaluation once bedrest is lifted  Emelda Fear 04/01/2023, 8:46 AM  Nyoka Cowden OTR/L Acute Rehabilitation Services Office: [phone]

## 2023-04-01 NOTE — Progress Notes (Signed)
PHARMACIST CODE STROKE RESPONSE  Notified to mix TNK at [phone] by Dr. Wilford Corner TNK preparation completed at 0655  TNK dose = 25 mg IV over 5 seconds   Samuel Clarke 04/01/23 7:02 AM

## 2023-04-01 NOTE — Code Documentation (Signed)
Stroke Response Nurse Documentation Code Documentation  Samuel Clarke is a 62 y.o. male arriving to [facility]  via [employer] on 10/7 with past medical hx of alcohol use, obesity. On No antithrombotic. Code stroke was activated by ED.   Patient from home where he was LKW at 0500 and now complaining of left arm tingling .   Stroke team at the bedside on patient arrival. Labs drawn and patient cleared for CT by Dr. Adela Lank. Patient to CT with team. NIHSS 3, see documentation for details and code stroke times. Patient with left arm weakness, left limb ataxia, and left decreased sensation on exam. The following imaging was completed:  CT Head and CTA. Patient is a candidate for IV Thrombolytic due to fixed neurologic deficit. Patient is not a candidate for IR due to No LVO.   Care Plan: TNK.   Bedside handoff with ED RN Alycia Rossetti.    Rose Fillers  Rapid Response RN

## 2023-04-01 NOTE — Progress Notes (Signed)
  Echocardiogram 2D Echocardiogram has been performed.  Samuel Clarke 04/01/2023, 3:45 PM

## 2023-04-01 NOTE — ED Triage Notes (Signed)
Pt arrived POV d/t tingling in left arm at 0500.  Pt awoke at 0400 and did not have the tingling.  Pt has strong and equal grips in triage.

## 2023-04-01 NOTE — ED Provider Notes (Signed)
[place] EMERGENCY DEPARTMENT AT [facility] Provider Note   CSN: [id number] Arrival date & time: 04/01/23  4098     History  Chief Complaint  Patient presents with   Tingling    Samuel Clarke is a 62 y.o. male.  62 yo M with a chief complaints of sudden onset left arm tingling.  He said it felt like his arm went to sleep from the shoulder all the way down to his fingertips.  This happened abruptly about an hour and a half ago.  He woke up and it was normal.  He felt he has been having some trouble using it throughout the morning.  As it is not gone away he came to be evaluated.  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  He has a remote injury to the left shoulder about 25 years ago.        Home Medications Prior to Admission medications   Medication Sig Start Date End Date Taking? Authorizing Provider  colchicine 0.6 MG tablet Take 1.2mg  (2 tablets) then 0.6mg  (1 tablet) 1 hour after. Then, take 1 tablet every day for 7 days. 10/03/22   Edwin Cap, DPM      Allergies    Patient has no known allergies.    Review of Systems   Review of Systems  Physical Exam Updated Vital Signs BP (!) 187/107 (BP Location: Right Arm)   Pulse 98   Temp 98.4 F (36.9 C) (Oral)   Resp 18   Ht 5\' 10"  (1.778 m)   Wt 99 kg   SpO2 100%   BMI 31.32 kg/m  Physical Exam Vitals and nursing note reviewed.  Constitutional:      Appearance: He is well-developed.  HENT:     Head: Normocephalic and atraumatic.  Eyes:     Pupils: Pupils are equal, round, and reactive to light.  Neck:     Vascular: No JVD.  Cardiovascular:     Rate and Rhythm: Normal rate and regular rhythm.     Heart sounds: No murmur heard.    No friction rub. No gallop.  Pulmonary:     Effort: No respiratory distress.     Breath sounds: No wheezing.  Abdominal:     General: There is no  distension.     Tenderness: There is no abdominal tenderness. There is no guarding or rebound.  Musculoskeletal:        General: Normal range of motion.     Cervical back: Normal range of motion and neck supple.  Skin:    Coloration: Skin is not pale.     Findings: No rash.  Neurological:     Mental Status: He is alert and oriented to person, place, and time.     Comments: Left upper extremity weakness.  Difficulty with finger-nose, ataxia.   Psychiatric:        Behavior: Behavior normal.     ED Results / Procedures / Treatments   Labs (all labs ordered are listed, but only abnormal results are displayed) Labs Reviewed  I-STAT CHEM 8, ED - Abnormal; Notable for the following components:      Result Value   Glucose, Bld 146 (*)    Calcium, Ion 1.05 (*)    All other components within normal limits  CBG MONITORING, ED - Abnormal; Notable for the following components:  Glucose-Capillary 141 (*)    All other components within normal limits  CBC  DIFFERENTIAL  ETHANOL  PROTIME-INR  APTT  COMPREHENSIVE METABOLIC PANEL  RAPID URINE DRUG SCREEN, HOSP PERFORMED  URINALYSIS, ROUTINE W REFLEX MICROSCOPIC    EKG None  Radiology CT HEAD CODE STROKE WO CONTRAST  Result Date: 04/01/2023 CLINICAL DATA:  Code stroke.  Stroke suspected EXAM: CT HEAD WITHOUT CONTRAST TECHNIQUE: Contiguous axial images were obtained from the base of the skull through the vertex without intravenous contrast. RADIATION DOSE REDUCTION: This exam was performed according to the departmental dose-optimization program which includes automated exposure control, adjustment of the mA and/or kV according to patient size and/or use of iterative reconstruction technique. COMPARISON:  None Available. FINDINGS: Brain: Small chronic right occipital cortex infarct. No evidence of acute infarct, hemorrhage, hydrocephalus, mass, or collection. Vascular: No hyperdense vessel or unexpected calcification. Skull: Normal. Negative  for fracture or focal lesion. Sinuses/Orbits: No acute finding. ASPECTS [facility] Stroke Program Early CT Score) - Ganglionic level infarction (caudate, lentiform nuclei, internal capsule, insula, M1-M3 cortex): 7 - Supraganglionic infarction (M4-M6 cortex): 3 Total score (0-10 with 10 being normal): 10 IMPRESSION: 1. No acute finding. 2. Small chronic right occipital cortex infarct. Electronically Signed   By: Tiburcio Pea M.D.   On: 04/01/2023 06:49    Procedures .Critical Care  Performed by: Melene Plan, DO Authorized by: Melene Plan, DO   Critical care provider statement:    Critical care time (minutes):  35   Critical care time was exclusive of:  Separately billable procedures and treating other patients   Critical care was time spent personally by me on the following activities:  Development of treatment plan with patient or surrogate, discussions with consultants, evaluation of patient's response to treatment, examination of patient, ordering and review of laboratory studies, ordering and review of radiographic studies, ordering and performing treatments and interventions, pulse oximetry, re-evaluation of patient's condition and review of old charts   Care discussed with: admitting provider       Medications Ordered in ED Medications  tenecteplase (TNKASE) injection for Stroke 25 mg (has no administration in time range)    ED Course/ Medical Decision Making/ A&P                                 Medical Decision Making Amount and/or Complexity of Data Reviewed Labs: ordered. Radiology: ordered.   62 yo M with a chief complaints of acute onset left upper arm weakness.  This happened about an hour and a half ago.  Will make the patient code stroke.  Given TNK, neuro admit.  The patients results and plan were reviewed and discussed.   Any x-rays performed were independently reviewed by myself.   Differential diagnosis were considered with the presenting HPI.  Medications   tenecteplase (TNKASE) injection for Stroke 25 mg (has no administration in time range)    Vitals:   04/01/23 0624 04/01/23 0633  BP: (!) 187/107   Pulse: 98   Resp: 18   Temp: 98.4 F (36.9 C)   TempSrc: Oral   SpO2: 100%   Weight:  99 kg  Height:  5\' 10"  (1.778 m)    Final diagnoses:  Weakness of left upper extremity    Admission/ observation were discussed with the admitting physician, patient and/or family and they are comfortable with the plan.          Final Clinical Impression(s) /  ED Diagnoses Final diagnoses:  Weakness of left upper extremity    Rx / DC Orders ED Discharge Orders     None         Melene Plan, DO 04/01/23 [phone]

## 2023-04-01 NOTE — Progress Notes (Signed)
PT Cancellation Note  Patient Details Name: Samuel Clarke MRN: [id number] DOB: 1960-10-06   Cancelled Treatment:    Reason Eval/Treat Not Completed: Active bedrest order after TNK administration. Will follow up tomorrow.    [name] [facility] 04/01/2023, 10:10 AM Skip Mayer PT Acute [employer] [phone]

## 2023-04-02 ENCOUNTER — Other Ambulatory Visit (HOSPITAL_COMMUNITY): Payer: Self-pay

## 2023-04-02 ENCOUNTER — Inpatient Hospital Stay (HOSPITAL_COMMUNITY): Payer: 59

## 2023-04-02 DIAGNOSIS — I4891 Unspecified atrial fibrillation: Secondary | ICD-10-CM | POA: Diagnosis not present

## 2023-04-02 DIAGNOSIS — I639 Cerebral infarction, unspecified: Secondary | ICD-10-CM | POA: Diagnosis not present

## 2023-04-02 DIAGNOSIS — I10 Essential (primary) hypertension: Secondary | ICD-10-CM | POA: Diagnosis not present

## 2023-04-02 LAB — GLUCOSE, CAPILLARY
Glucose-Capillary: 131 mg/dL — ABNORMAL HIGH (ref 70–99)
Glucose-Capillary: 98 mg/dL (ref 70–99)
Glucose-Capillary: 120 mg/dL — ABNORMAL HIGH (ref 70–99)

## 2023-04-02 LAB — LIPID PANEL
Cholesterol: 236 mg/dL — ABNORMAL HIGH (ref 0–200)
HDL: 64 mg/dL (ref >40)
LDL Cholesterol: 152 mg/dL — ABNORMAL HIGH (ref 0–99)
Total CHOL/HDL Ratio: 3.7 {ratio}
Triglycerides: 98 mg/dL (ref <150)
VLDL: 20 mg/dL (ref 0–40)

## 2023-04-02 LAB — TRIGLYCERIDES: Triglycerides: 101 mg/dL (ref <150)

## 2023-04-02 LAB — HEPARIN LEVEL (UNFRACTIONATED): Heparin Unfractionated: 0.44 [IU]/mL (ref 0.30–0.70)

## 2023-04-02 MED ORDER — PNEUMOCOCCAL 20-VAL CONJ VACC 0.5 ML IM SUSY
0.5000 mL | PREFILLED_SYRINGE | INTRAMUSCULAR | Status: AC
  Administered 2023-04-03: 0.5 mL via INTRAMUSCULAR
  Filled 2023-04-02: qty 0.5

## 2023-04-02 MED ORDER — CLOPIDOGREL BISULFATE 75 MG PO TABS
75.0000 mg | ORAL_TABLET | Freq: Every day | ORAL | Status: DC
Start: 2023-04-02 — End: 2023-04-02

## 2023-04-02 MED ORDER — INSULIN ASPART 100 UNIT/ML IJ SOLN
0.0000 [IU] | Freq: Three times a day (TID) | INTRAMUSCULAR | Status: DC
  Administered 2023-04-03: 1 [IU] via SUBCUTANEOUS

## 2023-04-02 MED ORDER — INFLUENZA VIRUS VACC SPLIT PF (FLUZONE) 0.5 ML IM SUSY
0.5000 mL | PREFILLED_SYRINGE | INTRAMUSCULAR | Status: AC
  Administered 2023-04-03: 0.5 mL via INTRAMUSCULAR
  Filled 2023-04-02: qty 0.5

## 2023-04-02 MED ORDER — ASPIRIN 300 MG RE SUPP
300.0000 mg | Freq: Every day | RECTAL | Status: AC
  Filled 2023-04-02: qty 1

## 2023-04-02 MED ORDER — ASPIRIN 81 MG PO TBEC: 81.0000 mg | DELAYED_RELEASE_TABLET | Freq: Every day | ORAL | Status: DC

## 2023-04-02 MED ORDER — HEPARIN (PORCINE) 25000 UT/250ML-% IV SOLN
950.0000 [IU]/h | INTRAVENOUS | Status: DC
  Administered 2023-04-02: 1100 [IU]/h via INTRAVENOUS
  Filled 2023-04-02: qty 250

## 2023-04-02 MED ORDER — ASPIRIN 81 MG PO CHEW
81.0000 mg | CHEWABLE_TABLET | Freq: Every day | ORAL | Status: AC
  Administered 2023-04-02: 81 mg via ORAL
  Filled 2023-04-02: qty 1

## 2023-04-02 MED ORDER — METOPROLOL SUCCINATE ER 50 MG PO TB24
50.0000 mg | ORAL_TABLET | Freq: Every day | ORAL | Status: DC
  Administered 2023-04-02 – 2023-04-03 (×2): 50 mg via ORAL
  Filled 2023-04-02 (×2): qty 1

## 2023-04-02 MED ORDER — ROSUVASTATIN CALCIUM 20 MG PO TABS
20.0000 mg | ORAL_TABLET | Freq: Every day | ORAL | Status: DC
  Administered 2023-04-02 – 2023-04-03 (×2): 20 mg via ORAL
  Filled 2023-04-02 (×2): qty 1

## 2023-04-02 MED ORDER — ORAL CARE MOUTH RINSE: 15.0000 mL | OROMUCOSAL | Status: DC | PRN

## 2023-04-02 MED ORDER — CHLORHEXIDINE GLUCONATE CLOTH 2 % EX PADS: 6.0000 | MEDICATED_PAD | Freq: Every day | CUTANEOUS | Status: DC

## 2023-04-02 NOTE — Progress Notes (Addendum)
STROKE TEAM PROGRESS NOTE   BRIEF HPI Mr. Samuel Clarke is a 62 y.o. 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  He said he was leaning on the wall with support from his left hand when it felt like something in the lines of when funny bone inside.  Initially started with tingling in the left hand became numb and difficult to operate.  His wife drove him to the emergency department.  He was seen at triage and a code stroke was activated after the MD examined him and found ataxia on examination   SIGNIFICANT HOSPITAL EVENTS TNK given 10/7 0655  INTERIM HISTORY/SUBJECTIVE  On exam, slightly diminished sensation to left hand and ataxia to LUE. Per patient, this is improved from when he came in.  24hr MRI shows infarct with some petechial hemorrhage.  Will transfer out of ICU and start Pradaxa. Pending PT/OT eval.  Cardiology consulted for A-fib management.  OBJECTIVE  CBC    Component Value Date/Time   WBC 8.4 04/01/2023 0639   RBC 4.57 04/01/2023 0639   HGB 15.3 04/01/2023 0639   HGB 13.3 04/01/2023 0639   HGB 13.1 10/03/2022 1449   HCT 45.0 04/01/2023 0639   HCT 39.8 04/01/2023 0639   HCT 40.8 10/03/2022 1449   PLT 281 04/01/2023 0639   PLT 267 10/03/2022 1449   MCV 87.1 04/01/2023 0639   MCV 89 10/03/2022 1449   MCH 29.1 04/01/2023 0639   MCHC 33.4 04/01/2023 0639   RDW 15.5 04/01/2023 0639   RDW 14.6 10/03/2022 1449   LYMPHSABS 3.6 04/01/2023 0639   LYMPHSABS 3.2 (H) 10/03/2022 1449   MONOABS 0.9 04/01/2023 0639   EOSABS 0.3 04/01/2023 0639   EOSABS 0.3 10/03/2022 1449   BASOSABS 0.1 04/01/2023 0639   BASOSABS 0.0 10/03/2022 1449    BMET    Component Value Date/Time   NA 136 04/01/2023 0639   NA 135 04/01/2023 0639    NA 140 10/03/2022 1449   K 4.2 04/01/2023 0639   K 4.2 04/01/2023 0639   CL 101 04/01/2023 0639   CL 97 (L) 04/01/2023 0639   CO2 22 04/01/2023 0639   GLUCOSE 146 (H) 04/01/2023 0639   GLUCOSE 148 (H) 04/01/2023 0639   BUN 9 04/01/2023 0639   BUN 9 04/01/2023 0639   BUN 12 10/03/2022 1449   CREATININE 0.80 04/01/2023 0639   CREATININE 0.84 04/01/2023 0639   CALCIUM 8.7 (L) 04/01/2023 0639   EGFR 101 10/03/2022 1449   GFRNONAA >60 04/01/2023 0639    IMAGING past 24 hours MR BRAIN WO CONTRAST  Result Date: 04/02/2023 CLINICAL DATA:  62 year old male code stroke presentation yesterday. Left upper extremity tingling. Status post T NK. EXAM: MRI HEAD WITHOUT CONTRAST TECHNIQUE: Multiplanar, multiecho pulse sequences of the brain and surrounding structures were obtained without intravenous contrast. COMPARISON:  Head CT and CTA head and neck yesterday. FINDINGS: Brain: Right superior frontal gyrus, motor or pre motor cortex and subcortical white matter restricted diffusion on series 2, image 40, series 3, image 19. This is about 15 mm. T2 and FLAIR hyperintensity associated (series 6, image 28) with petechial hemorrhage on series 7, image 75. No mass effect. No other diffusion restriction. Small area of chronic cortical encephalomalacia in the right occipital  pole on series 6, image 13. Possibly mild hemosiderin there also. No midline shift, mass effect, evidence of mass lesion, ventriculomegaly, extra-axial collection or acute intracranial hemorrhage. Cervicomedullary junction and pituitary are within normal limits. Outside of the above findings there is scattered mild white matter T2 and FLAIR hyperintensity which is more often in the right hemisphere. The deep gray nuclei, brainstem, and cerebellum appear normal. No other chronic cerebral blood products. Vascular: Major intracranial vascular flow voids are preserved. Skull and upper cervical spine: Negative for age visible cervical spine.  Visualized bone marrow signal is within normal limits. Sinuses/Orbits: Negative. Other: Negative visible internal auditory structures. Trace left mastoid fluid. Negative visible nasopharynx, scalp and face. IMPRESSION: 1. Positive for a small 1.5 cm Acute cortical and subcortical white matter infarct at the right superior frontal gyrus - motor or pre motor area. Petechial hemorrhage (Heidelberg classification 1a: HI1), but no malignant hemorrhagic transformation or mass effect. But no mass effect. 2. No other acute intracranial abnormality. Small chronic right occipital cortical infarct also with questionable hemosiderin. Otherwise mild for age white matter signal changes. Electronically Signed   By: Odessa Fleming M.D.   On: 04/02/2023 09:43   ECHOCARDIOGRAM COMPLETE  Result Date: 04/01/2023    ECHOCARDIOGRAM REPORT   Patient Name:   Samuel Clarke Date of Exam: 04/01/2023 Medical Rec #:  [id number]       Height:       70.0 in Accession #:    [id number]      Weight:       218.3 lb Date of Birth:  01/09/1961       BSA:          2.166 m Patient Age:    62 years        BP:           120/70 mmHg Patient Gender: M               HR:           103 bpm. Exam Location:  Inpatient Procedure: 2D Echo, Cardiac Doppler and Color Doppler Indications:    stroke  History:        Patient has no prior history of Echocardiogram examinations.                 Arrythmias:Atrial Fibrillation.  Sonographer:    Delcie Roch RDCS Referring Phys: [id number] ASHISH ARORA IMPRESSIONS  1. Left ventricular ejection fraction, by estimation, is 55 to 60%. The left ventricle has normal function. The left ventricle has no regional wall motion abnormalities. There is mild concentric left ventricular hypertrophy. Left ventricular diastolic function could not be evaluated.  2. Right ventricular systolic function is normal. The right ventricular size is normal. There is normal pulmonary artery systolic pressure.  3. Left atrial size was severely dilated.   4. The mitral valve is normal in structure. Trivial mitral valve regurgitation. No evidence of mitral stenosis.  5. The aortic valve is normal in structure. Aortic valve regurgitation is not visualized. No aortic stenosis is present.  6. The inferior vena cava is normal in size with greater than 50% respiratory variability, suggesting right atrial pressure of 3 mmHg. FINDINGS  Left Ventricle: Left ventricular ejection fraction, by estimation, is 55 to 60%. The left ventricle has normal function. The left ventricle has no regional wall motion abnormalities. The left ventricular internal cavity size was normal in size. There is  mild concentric left ventricular hypertrophy. Left ventricular diastolic function could not be  evaluated due to atrial fibrillation. Left ventricular diastolic function could not be evaluated. Right Ventricle: The right ventricular size is normal. No increase in right ventricular wall thickness. Right ventricular systolic function is normal. There is normal pulmonary artery systolic pressure. The tricuspid regurgitant velocity is 1.23 m/s, and  with an assumed right atrial pressure of 3 mmHg, the estimated right ventricular systolic pressure is 9.1 mmHg. Left Atrium: Left atrial size was severely dilated. Right Atrium: Right atrial size was normal in size. Pericardium: There is no evidence of pericardial effusion. Mitral Valve: The mitral valve is normal in structure. There is mild calcification of the mitral valve leaflet(s). Mild to moderate mitral annular calcification. Trivial mitral valve regurgitation. No evidence of mitral valve stenosis. Tricuspid Valve: The tricuspid valve is normal in structure. Tricuspid valve regurgitation is trivial. No evidence of tricuspid stenosis. Aortic Valve: The aortic valve is normal in structure. Aortic valve regurgitation is not visualized. No aortic stenosis is present. Pulmonic Valve: The pulmonic valve was normal in structure. Pulmonic valve  regurgitation is not visualized. No evidence of pulmonic stenosis. Aorta: The aortic root is normal in size and structure. Venous: The inferior vena cava is normal in size with greater than 50% respiratory variability, suggesting right atrial pressure of 3 mmHg. IAS/Shunts: No atrial level shunt detected by color flow Doppler.  LEFT VENTRICLE PLAX 2D LVIDd:         4.20 cm LVIDs:         3.00 cm LV PW:         1.20 cm LV IVS:        1.20 cm LVOT diam:     2.10 cm LV SV:         54 LV SV Index:   25 LVOT Area:     3.46 cm  RIGHT VENTRICLE             IVC RV Basal diam:  3.00 cm     IVC diam: 1.00 cm RV S prime:     10.60 cm/s TAPSE (M-mode): 1.2 cm LEFT ATRIUM              Index        RIGHT ATRIUM           Index LA diam:        4.60 cm  2.12 cm/m   RA Area:     16.70 cm LA Vol (A2C):   103.0 ml 47.55 ml/m  RA Volume:   43.10 ml  19.90 ml/m LA Vol (A4C):   97.5 ml  45.01 ml/m LA Biplane Vol: 102.0 ml 47.09 ml/m  AORTIC VALVE LVOT Vmax:   97.40 cm/s LVOT Vmean:  72.100 cm/s LVOT VTI:    0.156 m  AORTA Ao Root diam: 3.20 cm Ao Asc diam:  3.90 cm TRICUSPID VALVE TR Peak grad:   6.1 mmHg TR Vmax:        123.00 cm/s  SHUNTS Systemic VTI:  0.16 m Systemic Diam: 2.10 cm Chilton Si MD Electronically signed by Chilton Si MD Signature Date/Time: 04/01/2023/4:54:23 PM    Final     Vitals:   04/02/23 0800 04/02/23 0900 04/02/23 0930 04/02/23 1000  BP: (!) 160/94 (!) 166/93 (!) 156/87 (!) 158/97  Pulse: 99 97 90 91  Resp: (!) 27 17 18 16   Temp:      TempSrc:      SpO2: 96% 96% 95% 97%  Weight:      Height:  PHYSICAL EXAM General:  Alert, well-nourished, well-developed patient in no acute distress Psych:  Mood and affect appropriate for situation CV: Regular rate and rhythm on monitor Respiratory:  Regular, unlabored respirations on room air GI: Abdomen soft and nontender   NEURO:  Mental Status: AA&Ox3, patient is able to give clear and coherent history Speech/Language: speech is  without dysarthria or aphasia.  Naming, repetition, fluency, and comprehension intact.  Cranial Nerves:  II: PERRL. Visual fields full.  III, IV, VI: EOMI. Eyelids elevate symmetrically.  V: Sensation is intact to light touch and symmetrical to face.  VII: Face is symmetrical resting and smiling VIII: hearing intact to voice. IX, X: Palate elevates symmetrically. Phonation is normal.  FA:[id number] shrug 5/5. XII: tongue is midline without fasciculations. Motor: 5/5 strength to all muscle groups tested. No drift.  Tone: is normal and bulk is normal Sensation- Diminished to left hand (improved per patient). Extinction absent to light touch to DSS.   Coordination: FTN intact with ataxia on LUE (improved per patient).  Gait- deferred  ASSESSMENT/PLAN  Acute Ischemic Infarct:  right cortical and subcortical white matter s/p TNK Etiology:  pending full workup Code Stroke CT head  No acute abnormality.   Small chronic right occipital cortex infarct CTA head & neck  No LVO  MRI   Small 1.5 cm acute cortical and subcortical white matter infarct right superior frontal gyrus Petechial hemorrhage with Heidelberg classification 1A but no malignant hemorrhagic transformation or mass effect Small chronic right occipital cortical infarct  2D Echo: LVEF 55 to 60%, mild concentric LVH, severely dilated left atria, trivial MVR  LDL 152 HgbA1c 7.1 VTE prophylaxis - lovenox No antithrombotic prior to admission, now on Pradaxa d/t new diagnosis of Afib. Therapy recommendations:  Pending Disposition:  pending  Atrial Fibrillation - new diagnosis Seen on tele 10/8 AM Begin anticoagulation with: Pradaxa Consult Cardiology  Hypertension Home meds:  none Stable Blood Pressure Goal: BP less than 180/105   Hyperlipidemia Home meds:  none LDL 152, goal < 70 Add Crestor 20mg   Continue statin at discharge  Diabetes type II, Previously Undiagnosed Home meds:  none HgbA1c 7.1, goal <  7.0 CBGs SSI Recommend close follow-up with PCP for better DM control  Other Stroke Risk Factors ETOH use, alcohol level <10, advised to drink no more than 1-2 drink(s) a day Obesity, Body mass index is 31.32 kg/m., BMI >/= 30 associated with increased stroke risk, recommend weight loss, diet and exercise as appropriate   Other Active Problems Jehovah's Witness-no blood products   Hospital day # 1  Pt seen by Neuro NP/APP and later by MD. Note/plan to be edited by MD as needed.    Lynnae January, DNP, AGACNP-BC [facility] Please use AMION for contact information & EPIC for messaging.  ATTENDING ATTESTATION:  61 year old with right frontal stroke by MRI status post TNK with successful resolution of his symptoms.  He is essentially back to his baseline he says he is about 98% sensation left arm compared to right.  He still has little trouble with dexterity left hand.  He has new onset atrial fibrillation in ICU.  Appears to be continuous.  He will need anticoagulation for stroke prevention will start Pradaxa.  MRI with petechial hemorrhage however clinically is stable.  Consult cardiology for management atrial fibrillation.  Transfer out of the ICU today.  ADDENDUM: After discussion with pharmacy given that he cannot get blood products and he will be staying for 1 more day to  manage his atrial fibrillation will start heparin and 81 mg aspirin today with transition to Pradaxa tomorrow in case reversal is needed.  Dr. Viviann Spare evaluated pt independently, reviewed imaging, chart, labs. Discussed and formulated plan with the Resident/APP. Changes were made to the note where appropriate. Please see APP/resident note above for details.   Total 36 minutes spent on counseling patient and coordinating care, writing notes and reviewing chart.   Tytus Strahle,MD   To contact Stroke Continuity provider, please refer to [email or web address]. After hours, contact General Neurology

## 2023-04-02 NOTE — Plan of Care (Signed)
Problem: Education: Goal: Knowledge of disease or condition will improve Outcome: Progressing Goal: Knowledge of secondary prevention will improve (MUST DOCUMENT ALL) Outcome: Progressing Goal: Knowledge of patient specific risk factors will improve Loraine Leriche N/A or DELETE if not current risk factor) Outcome: Progressing   Problem: Ischemic Stroke/TIA Tissue Perfusion: Goal: Complications of ischemic stroke/TIA will be minimized Outcome: Progressing   Problem: Coping: Goal: Will verbalize positive feelings about self Outcome: Progressing Goal: Will identify appropriate support needs Outcome: Progressing   Problem: Health Behavior/Discharge Planning: Goal: Ability to manage health-related needs will improve Outcome: Progressing Goal: Goals will be collaboratively established with patient/family Outcome: Progressing   Problem: Self-Care: Goal: Ability to participate in self-care as condition permits will improve Outcome: Progressing Goal: Verbalization of feelings and concerns over difficulty with self-care will improve Outcome: Progressing Goal: Ability to communicate needs accurately will improve Outcome: Progressing   Problem: Nutrition: Goal: Risk of aspiration will decrease Outcome: Progressing Goal: Dietary intake will improve Outcome: Progressing   Problem: Education: Goal: Knowledge of General Education information will improve Description: Including pain rating scale, medication(s)/side effects and non-pharmacologic comfort measures Outcome: Progressing   Problem: Health Behavior/Discharge Planning: Goal: Ability to manage health-related needs will improve Outcome: Progressing   Problem: Clinical Measurements: Goal: Ability to maintain clinical measurements within normal limits will improve Outcome: Progressing Goal: Will remain free from infection Outcome: Progressing Goal: Diagnostic test results will improve Outcome: Progressing Goal: Respiratory  complications will improve Outcome: Progressing Goal: Cardiovascular complication will be avoided Outcome: Progressing   Problem: Activity: Goal: Risk for activity intolerance will decrease Outcome: Progressing   Problem: Nutrition: Goal: Adequate nutrition will be maintained Outcome: Progressing   Problem: Coping: Goal: Level of anxiety will decrease Outcome: Progressing   Problem: Elimination: Goal: Will not experience complications related to bowel motility Outcome: Progressing Goal: Will not experience complications related to urinary retention Outcome: Progressing   Problem: Pain Managment: Goal: General experience of comfort will improve Outcome: Progressing   Problem: Safety: Goal: Ability to remain free from injury will improve Outcome: Progressing   Problem: Skin Integrity: Goal: Risk for impaired skin integrity will decrease Outcome: Progressing   Problem: Education: Goal: Knowledge of disease or condition will improve Outcome: Progressing Goal: Knowledge of secondary prevention will improve (MUST DOCUMENT ALL) Outcome: Progressing Goal: Knowledge of patient specific risk factors will improve Loraine Leriche N/A or DELETE if not current risk factor) Outcome: Progressing   Problem: Ischemic Stroke/TIA Tissue Perfusion: Goal: Complications of ischemic stroke/TIA will be minimized Outcome: Progressing   Problem: Coping: Goal: Will verbalize positive feelings about self Outcome: Progressing Goal: Will identify appropriate support needs Outcome: Progressing   Problem: Health Behavior/Discharge Planning: Goal: Ability to manage health-related needs will improve Outcome: Progressing Goal: Goals will be collaboratively established with patient/family Outcome: Progressing   Problem: Self-Care: Goal: Ability to participate in self-care as condition permits will improve Outcome: Progressing Goal: Verbalization of feelings and concerns over difficulty with self-care  will improve Outcome: Progressing Goal: Ability to communicate needs accurately will improve Outcome: Progressing   Problem: Nutrition: Goal: Risk of aspiration will decrease Outcome: Progressing Goal: Dietary intake will improve Outcome: Progressing   Problem: Education: Goal: Knowledge of General Education information will improve Description: Including pain rating scale, medication(s)/side effects and non-pharmacologic comfort measures Outcome: Progressing   Problem: Health Behavior/Discharge Planning: Goal: Ability to manage health-related needs will improve Outcome: Progressing   Problem: Clinical Measurements: Goal: Ability to maintain clinical measurements within normal limits will improve Outcome: Progressing Goal: Will remain free  from infection Outcome: Progressing Goal: Diagnostic test results will improve Outcome: Progressing Goal: Respiratory complications will improve Outcome: Progressing Goal: Cardiovascular complication will be avoided Outcome: Progressing   Problem: Activity: Goal: Risk for activity intolerance will decrease Outcome: Progressing   Problem: Nutrition: Goal: Adequate nutrition will be maintained Outcome: Progressing   Problem: Coping: Goal: Level of anxiety will decrease Outcome: Progressing   Problem: Elimination: Goal: Will not experience complications related to bowel motility Outcome: Progressing Goal: Will not experience complications related to urinary retention Outcome: Progressing   Problem: Pain Managment: Goal: General experience of comfort will improve Outcome: Progressing   Problem: Safety: Goal: Ability to remain free from injury will improve Outcome: Progressing   Problem: Skin Integrity: Goal: Risk for impaired skin integrity will decrease Outcome: Progressing   Problem: Education: Goal: Knowledge of disease or condition will improve Outcome: Progressing Goal: Knowledge of secondary prevention will improve  (MUST DOCUMENT ALL) Outcome: Progressing Goal: Knowledge of patient specific risk factors will improve Loraine Leriche N/A or DELETE if not current risk factor) Outcome: Progressing   Problem: Ischemic Stroke/TIA Tissue Perfusion: Goal: Complications of ischemic stroke/TIA will be minimized Outcome: Progressing   Problem: Coping: Goal: Will verbalize positive feelings about self Outcome: Progressing Goal: Will identify appropriate support needs Outcome: Progressing   Problem: Health Behavior/Discharge Planning: Goal: Ability to manage health-related needs will improve Outcome: Progressing Goal: Goals will be collaboratively established with patient/family Outcome: Progressing   Problem: Self-Care: Goal: Ability to participate in self-care as condition permits will improve Outcome: Progressing Goal: Verbalization of feelings and concerns over difficulty with self-care will improve Outcome: Progressing Goal: Ability to communicate needs accurately will improve Outcome: Progressing   Problem: Nutrition: Goal: Risk of aspiration will decrease Outcome: Progressing Goal: Dietary intake will improve Outcome: Progressing   Problem: Education: Goal: Knowledge of General Education information will improve Description: Including pain rating scale, medication(s)/side effects and non-pharmacologic comfort measures Outcome: Progressing   Problem: Health Behavior/Discharge Planning: Goal: Ability to manage health-related needs will improve Outcome: Progressing   Problem: Clinical Measurements: Goal: Ability to maintain clinical measurements within normal limits will improve Outcome: Progressing Goal: Will remain free from infection Outcome: Progressing Goal: Diagnostic test results will improve Outcome: Progressing Goal: Respiratory complications will improve Outcome: Progressing Goal: Cardiovascular complication will be avoided Outcome: Progressing   Problem: Activity: Goal: Risk for  activity intolerance will decrease Outcome: Progressing   Problem: Nutrition: Goal: Adequate nutrition will be maintained Outcome: Progressing   Problem: Coping: Goal: Level of anxiety will decrease Outcome: Progressing   Problem: Elimination: Goal: Will not experience complications related to bowel motility Outcome: Progressing Goal: Will not experience complications related to urinary retention Outcome: Progressing   Problem: Pain Managment: Goal: General experience of comfort will improve Outcome: Progressing   Problem: Safety: Goal: Ability to remain free from injury will improve Outcome: Progressing   Problem: Skin Integrity: Goal: Risk for impaired skin integrity will decrease Outcome: Progressing

## 2023-04-02 NOTE — Consult Note (Signed)
Cardiology Consultation   Patient ID: Blas Riches MRN: [id number]; DOB: 1961-05-14  Admit date: 04/01/2023 Date of Consult: 04/02/2023  PCP:  Caroline More, MD   [facility] Providers Cardiologist:  None        Patient Profile:   Stephenson Cichy is a 62 y.o. male with a hx of GIST (previously followed at [facility], reportedly transferring care locally due to insurance) who is being seen 04/02/2023 for the evaluation of newly noted afib in the setting of CVA at the request of Dr. Viviann Spare.  History of Present Illness:   Mr. Mounsey presented to the ED early on the morning of 10/7 with acute weakness and tingling of his left arm. Last known normal was around 5am. He described a progression of symptoms from tingling to significant numbness/weakness. CT head inconclusive but with exam positive for acute neuro deficit, patient received TNK. MRI showed small 1.5cm acute cortical and subcortical white matter infarct at superior frontal gyrus with petechial hemorrhage but no malignant hemorrhagic transformation. Today cardiology consulted for management of afib.  On exam, patient reports that his left arm feels "99% back to normal." We discussed atrial fibrillation and its association with increased stroke risk. Patient reports episodes of dizziness with palpitations/irregular heartbeat sensation back in 2018. PCP recommended a heart monitor, but patient reports he had full resolution of symptoms with a diet change and so he declined the monitor. 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 He is unsure if palpitations/irregular rhythm accompanied these episodes.   Today, rates remain elevated, 90s-120s and patient notes that he had more fatigue/shortness of breath than expected while working with PT/OT. He is otherwise without symptoms and denies palpitations, chest pain, dyspnea at rest.  Regarding afib risk  factors, patient's spouse confirms nightly snoring. Patient also reports consumption of about 3 alcoholic drinks per night.   He denies any recent evidence of GI bleeding and reports occasional epistaxis.   Past Medical History:  Diagnosis Date   Alcohol use    Gastric mass    /notes 02/04/2017   Heart palpitations 06/2016   Normocytic anemia 01/2017   Obesity 06/2016   Patient is Jehov[employer]'s Witness    Pleurisy ~ 2008   Refusal of blood transfusions as patient is Jehovah's Witness    Rosacea     Past Surgical History:  Procedure Laterality Date   ABDOMINAL SURGERY     ESOPHAGOGASTRODUODENOSCOPY     ESOPHAGOGASTRODUODENOSCOPY N/A 02/04/2017   Procedure: ESOPHAGOGASTRODUODENOSCOPY (EGD);  Surgeon: Meryl Dare, MD;  Location: [facility] ENDOSCOPY;  Service: Endoscopy;  Laterality: N/A;   TONSILLECTOMY     At age 69 or 7     Home Medications:  Prior to Admission medications   Medication Sig Start Date End Date Taking? Authorizing Provider  colchicine 0.6 MG tablet Take 1.2mg  (2 tablets) then 0.6mg  (1 tablet) 1 hour after. Then, take 1 tablet every day for 7 days. Patient not taking: Reported on 04/01/2023 10/03/22   Edwin Cap, DPM    Inpatient Medications: Scheduled Meds:  insulin aspart  0-15 Units Subcutaneous TID WC   metoprolol succinate  50 mg Oral Daily   pantoprazole (PROTONIX) IV  40 mg Intravenous QHS   rosuvastatin  20 mg Oral Daily   Continuous Infusions:  clevidipine Stopped (04/01/23 1929)   heparin 1,100 Units/hr (04/02/23 1500)   PRN Meds: acetaminophen **OR** acetaminophen (TYLENOL) oral liquid 160 mg/5 mL **OR**  Cardiology Consultation   Patient ID: Blas Riches MRN: [id number]; DOB: 1961-05-14  Admit date: 04/01/2023 Date of Consult: 04/02/2023  PCP:  Caroline More, MD   [facility] Providers Cardiologist:  None        Patient Profile:   Stephenson Cichy is a 62 y.o. male with a hx of GIST (previously followed at [facility], reportedly transferring care locally due to insurance) who is being seen 04/02/2023 for the evaluation of newly noted afib in the setting of CVA at the request of Dr. Viviann Spare.  History of Present Illness:   Mr. Mounsey presented to the ED early on the morning of 10/7 with acute weakness and tingling of his left arm. Last known normal was around 5am. He described a progression of symptoms from tingling to significant numbness/weakness. CT head inconclusive but with exam positive for acute neuro deficit, patient received TNK. MRI showed small 1.5cm acute cortical and subcortical white matter infarct at superior frontal gyrus with petechial hemorrhage but no malignant hemorrhagic transformation. Today cardiology consulted for management of afib.  On exam, patient reports that his left arm feels "99% back to normal." We discussed atrial fibrillation and its association with increased stroke risk. Patient reports episodes of dizziness with palpitations/irregular heartbeat sensation back in 2018. PCP recommended a heart monitor, but patient reports he had full resolution of symptoms with a diet change and so he declined the monitor. 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 He is unsure if palpitations/irregular rhythm accompanied these episodes.   Today, rates remain elevated, 90s-120s and patient notes that he had more fatigue/shortness of breath than expected while working with PT/OT. He is otherwise without symptoms and denies palpitations, chest pain, dyspnea at rest.  Regarding afib risk  factors, patient's spouse confirms nightly snoring. Patient also reports consumption of about 3 alcoholic drinks per night.   He denies any recent evidence of GI bleeding and reports occasional epistaxis.   Past Medical History:  Diagnosis Date   Alcohol use    Gastric mass    /notes 02/04/2017   Heart palpitations 06/2016   Normocytic anemia 01/2017   Obesity 06/2016   Patient is Jehov[employer]'s Witness    Pleurisy ~ 2008   Refusal of blood transfusions as patient is Jehovah's Witness    Rosacea     Past Surgical History:  Procedure Laterality Date   ABDOMINAL SURGERY     ESOPHAGOGASTRODUODENOSCOPY     ESOPHAGOGASTRODUODENOSCOPY N/A 02/04/2017   Procedure: ESOPHAGOGASTRODUODENOSCOPY (EGD);  Surgeon: Meryl Dare, MD;  Location: [facility] ENDOSCOPY;  Service: Endoscopy;  Laterality: N/A;   TONSILLECTOMY     At age 69 or 7     Home Medications:  Prior to Admission medications   Medication Sig Start Date End Date Taking? Authorizing Provider  colchicine 0.6 MG tablet Take 1.2mg  (2 tablets) then 0.6mg  (1 tablet) 1 hour after. Then, take 1 tablet every day for 7 days. Patient not taking: Reported on 04/01/2023 10/03/22   Edwin Cap, DPM    Inpatient Medications: Scheduled Meds:  insulin aspart  0-15 Units Subcutaneous TID WC   metoprolol succinate  50 mg Oral Daily   pantoprazole (PROTONIX) IV  40 mg Intravenous QHS   rosuvastatin  20 mg Oral Daily   Continuous Infusions:  clevidipine Stopped (04/01/23 1929)   heparin 1,100 Units/hr (04/02/23 1500)   PRN Meds: acetaminophen **OR** acetaminophen (TYLENOL) oral liquid 160 mg/5 mL **OR**  Cardiology Consultation   Patient ID: Blas Riches MRN: [id number]; DOB: 1961-05-14  Admit date: 04/01/2023 Date of Consult: 04/02/2023  PCP:  Caroline More, MD   [facility] Providers Cardiologist:  None        Patient Profile:   Stephenson Cichy is a 62 y.o. male with a hx of GIST (previously followed at [facility], reportedly transferring care locally due to insurance) who is being seen 04/02/2023 for the evaluation of newly noted afib in the setting of CVA at the request of Dr. Viviann Spare.  History of Present Illness:   Mr. Mounsey presented to the ED early on the morning of 10/7 with acute weakness and tingling of his left arm. Last known normal was around 5am. He described a progression of symptoms from tingling to significant numbness/weakness. CT head inconclusive but with exam positive for acute neuro deficit, patient received TNK. MRI showed small 1.5cm acute cortical and subcortical white matter infarct at superior frontal gyrus with petechial hemorrhage but no malignant hemorrhagic transformation. Today cardiology consulted for management of afib.  On exam, patient reports that his left arm feels "99% back to normal." We discussed atrial fibrillation and its association with increased stroke risk. Patient reports episodes of dizziness with palpitations/irregular heartbeat sensation back in 2018. PCP recommended a heart monitor, but patient reports he had full resolution of symptoms with a diet change and so he declined the monitor. 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 He is unsure if palpitations/irregular rhythm accompanied these episodes.   Today, rates remain elevated, 90s-120s and patient notes that he had more fatigue/shortness of breath than expected while working with PT/OT. He is otherwise without symptoms and denies palpitations, chest pain, dyspnea at rest.  Regarding afib risk  factors, patient's spouse confirms nightly snoring. Patient also reports consumption of about 3 alcoholic drinks per night.   He denies any recent evidence of GI bleeding and reports occasional epistaxis.   Past Medical History:  Diagnosis Date   Alcohol use    Gastric mass    /notes 02/04/2017   Heart palpitations 06/2016   Normocytic anemia 01/2017   Obesity 06/2016   Patient is Jehov[employer]'s Witness    Pleurisy ~ 2008   Refusal of blood transfusions as patient is Jehovah's Witness    Rosacea     Past Surgical History:  Procedure Laterality Date   ABDOMINAL SURGERY     ESOPHAGOGASTRODUODENOSCOPY     ESOPHAGOGASTRODUODENOSCOPY N/A 02/04/2017   Procedure: ESOPHAGOGASTRODUODENOSCOPY (EGD);  Surgeon: Meryl Dare, MD;  Location: [facility] ENDOSCOPY;  Service: Endoscopy;  Laterality: N/A;   TONSILLECTOMY     At age 69 or 7     Home Medications:  Prior to Admission medications   Medication Sig Start Date End Date Taking? Authorizing Provider  colchicine 0.6 MG tablet Take 1.2mg  (2 tablets) then 0.6mg  (1 tablet) 1 hour after. Then, take 1 tablet every day for 7 days. Patient not taking: Reported on 04/01/2023 10/03/22   Edwin Cap, DPM    Inpatient Medications: Scheduled Meds:  insulin aspart  0-15 Units Subcutaneous TID WC   metoprolol succinate  50 mg Oral Daily   pantoprazole (PROTONIX) IV  40 mg Intravenous QHS   rosuvastatin  20 mg Oral Daily   Continuous Infusions:  clevidipine Stopped (04/01/23 1929)   heparin 1,100 Units/hr (04/02/23 1500)   PRN Meds: acetaminophen **OR** acetaminophen (TYLENOL) oral liquid 160 mg/5 mL **OR**  Cardiology Consultation   Patient ID: Blas Riches MRN: [id number]; DOB: 1961-05-14  Admit date: 04/01/2023 Date of Consult: 04/02/2023  PCP:  Caroline More, MD   [facility] Providers Cardiologist:  None        Patient Profile:   Stephenson Cichy is a 62 y.o. male with a hx of GIST (previously followed at [facility], reportedly transferring care locally due to insurance) who is being seen 04/02/2023 for the evaluation of newly noted afib in the setting of CVA at the request of Dr. Viviann Spare.  History of Present Illness:   Mr. Mounsey presented to the ED early on the morning of 10/7 with acute weakness and tingling of his left arm. Last known normal was around 5am. He described a progression of symptoms from tingling to significant numbness/weakness. CT head inconclusive but with exam positive for acute neuro deficit, patient received TNK. MRI showed small 1.5cm acute cortical and subcortical white matter infarct at superior frontal gyrus with petechial hemorrhage but no malignant hemorrhagic transformation. Today cardiology consulted for management of afib.  On exam, patient reports that his left arm feels "99% back to normal." We discussed atrial fibrillation and its association with increased stroke risk. Patient reports episodes of dizziness with palpitations/irregular heartbeat sensation back in 2018. PCP recommended a heart monitor, but patient reports he had full resolution of symptoms with a diet change and so he declined the monitor. 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 He is unsure if palpitations/irregular rhythm accompanied these episodes.   Today, rates remain elevated, 90s-120s and patient notes that he had more fatigue/shortness of breath than expected while working with PT/OT. He is otherwise without symptoms and denies palpitations, chest pain, dyspnea at rest.  Regarding afib risk  factors, patient's spouse confirms nightly snoring. Patient also reports consumption of about 3 alcoholic drinks per night.   He denies any recent evidence of GI bleeding and reports occasional epistaxis.   Past Medical History:  Diagnosis Date   Alcohol use    Gastric mass    /notes 02/04/2017   Heart palpitations 06/2016   Normocytic anemia 01/2017   Obesity 06/2016   Patient is Jehovah's Witness    Pleurisy ~ 2008   Refusal of blood transfusions as patient is Jehovah's Witness    Rosacea     Past Surgical History:  Procedure Laterality Date   ABDOMINAL SURGERY     ESOPHAGOGASTRODUODENOSCOPY     ESOPHAGOGASTRODUODENOSCOPY N/A 02/04/2017   Procedure: ESOPHAGOGASTRODUODENOSCOPY (EGD);  Surgeon: Meryl Dare, MD;  Location: [facility] ENDOSCOPY;  Service: Endoscopy;  Laterality: N/A;   TONSILLECTOMY     At age 69 or 7     Home Medications:  Prior to Admission medications   Medication Sig Start Date End Date Taking? Authorizing Provider  colchicine 0.6 MG tablet Take 1.2mg  (2 tablets) then 0.6mg  (1 tablet) 1 hour after. Then, take 1 tablet every day for 7 days. Patient not taking: Reported on 04/01/2023 10/03/22   Edwin Cap, DPM    Inpatient Medications: Scheduled Meds:  insulin aspart  0-15 Units Subcutaneous TID WC   metoprolol succinate  50 mg Oral Daily   pantoprazole (PROTONIX) IV  40 mg Intravenous QHS   rosuvastatin  20 mg Oral Daily   Continuous Infusions:  clevidipine Stopped (04/01/23 1929)   heparin 1,100 Units/hr (04/02/23 1500)   PRN Meds: acetaminophen **OR** acetaminophen (TYLENOL) oral liquid 160 mg/5 mL **OR**  DDimer No results for input(s): "DDIMER" in the last 168 hours.   Radiology/Studies:  MR BRAIN WO CONTRAST  Result Date: 04/02/2023 CLINICAL DATA:  62 year old male code stroke presentation yesterday. Left upper extremity tingling. Status post T NK. EXAM: MRI HEAD WITHOUT CONTRAST TECHNIQUE: Multiplanar, multiecho pulse sequences of the brain and surrounding structures were obtained without intravenous contrast. COMPARISON:  Head CT and CTA head and neck yesterday. FINDINGS: Brain: Right superior frontal gyrus, motor or pre motor cortex and subcortical white matter restricted diffusion on series 2, image 40, series 3, image 19. This is about 15 mm. T2 and FLAIR hyperintensity associated (series 6, image 28) with petechial hemorrhage on series 7, image 75. No mass effect. No other diffusion restriction. Small area of chronic cortical encephalomalacia in the right occipital pole on series 6, image 13. Possibly mild hemosiderin there also. No midline shift, mass effect, evidence of mass lesion, ventriculomegaly, extra-axial collection or acute intracranial hemorrhage. Cervicomedullary junction and pituitary are within normal limits. Outside of the above findings there is scattered mild white matter T2 and FLAIR hyperintensity which is more often in the right hemisphere. The deep gray nuclei, brainstem, and cerebellum appear normal. No other chronic cerebral blood products. Vascular: Major intracranial vascular flow voids are preserved. Skull and upper cervical spine: Negative for age visible cervical spine. Visualized bone marrow signal is within normal limits. Sinuses/Orbits: Negative. Other: Negative visible internal auditory structures. Trace left mastoid fluid. Negative visible nasopharynx, scalp and face. IMPRESSION: 1. Positive for a small 1.5 cm Acute cortical  and subcortical white matter infarct at the right superior frontal gyrus - motor or pre motor area. Petechial hemorrhage (Heidelberg classification 1a: HI1), but no malignant hemorrhagic transformation or mass effect. But no mass effect. 2. No other acute intracranial abnormality. Small chronic right occipital cortical infarct also with questionable hemosiderin. Otherwise mild for age white matter signal changes. Electronically Signed   By: Odessa Fleming M.D.   On: 04/02/2023 09:43   ECHOCARDIOGRAM COMPLETE  Result Date: 04/01/2023    ECHOCARDIOGRAM REPORT   Patient Name:   Herron Huot Date of Exam: 04/01/2023 Medical Rec #:  [id number]       Height:       70.0 in Accession #:    [id number]      Weight:       218.3 lb Date of Birth:  1960-07-20       BSA:          2.166 m Patient Age:    62 years        BP:           120/70 mmHg Patient Gender: M               HR:           103 bpm. Exam Location:  Inpatient Procedure: 2D Echo, Cardiac Doppler and Color Doppler Indications:    stroke  History:        Patient has no prior history of Echocardiogram examinations.                 Arrythmias:Atrial Fibrillation.  Sonographer:    Delcie Roch RDCS Referring Phys: [id number] ASHISH ARORA IMPRESSIONS  1. Left ventricular ejection fraction, by estimation, is 55 to 60%. The left ventricle has normal function. The left ventricle has no regional wall motion abnormalities. There is mild concentric left ventricular hypertrophy. Left ventricular diastolic function could not be evaluated.  2. Right ventricular systolic function is normal. The  Cardiology Consultation   Patient ID: Blas Riches MRN: [id number]; DOB: 1961-05-14  Admit date: 04/01/2023 Date of Consult: 04/02/2023  PCP:  Caroline More, MD   [facility] Providers Cardiologist:  None        Patient Profile:   Stephenson Cichy is a 62 y.o. male with a hx of GIST (previously followed at [facility], reportedly transferring care locally due to insurance) who is being seen 04/02/2023 for the evaluation of newly noted afib in the setting of CVA at the request of Dr. Viviann Spare.  History of Present Illness:   Mr. Mounsey presented to the ED early on the morning of 10/7 with acute weakness and tingling of his left arm. Last known normal was around 5am. He described a progression of symptoms from tingling to significant numbness/weakness. CT head inconclusive but with exam positive for acute neuro deficit, patient received TNK. MRI showed small 1.5cm acute cortical and subcortical white matter infarct at superior frontal gyrus with petechial hemorrhage but no malignant hemorrhagic transformation. Today cardiology consulted for management of afib.  On exam, patient reports that his left arm feels "99% back to normal." We discussed atrial fibrillation and its association with increased stroke risk. Patient reports episodes of dizziness with palpitations/irregular heartbeat sensation back in 2018. PCP recommended a heart monitor, but patient reports he had full resolution of symptoms with a diet change and so he declined the monitor. 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 He is unsure if palpitations/irregular rhythm accompanied these episodes.   Today, rates remain elevated, 90s-120s and patient notes that he had more fatigue/shortness of breath than expected while working with PT/OT. He is otherwise without symptoms and denies palpitations, chest pain, dyspnea at rest.  Regarding afib risk  factors, patient's spouse confirms nightly snoring. Patient also reports consumption of about 3 alcoholic drinks per night.   He denies any recent evidence of GI bleeding and reports occasional epistaxis.   Past Medical History:  Diagnosis Date   Alcohol use    Gastric mass    /notes 02/04/2017   Heart palpitations 06/2016   Normocytic anemia 01/2017   Obesity 06/2016   Patient is Jehov[employer]'s Witness    Pleurisy ~ 2008   Refusal of blood transfusions as patient is Jehovah's Witness    Rosacea     Past Surgical History:  Procedure Laterality Date   ABDOMINAL SURGERY     ESOPHAGOGASTRODUODENOSCOPY     ESOPHAGOGASTRODUODENOSCOPY N/A 02/04/2017   Procedure: ESOPHAGOGASTRODUODENOSCOPY (EGD);  Surgeon: Meryl Dare, MD;  Location: [facility] ENDOSCOPY;  Service: Endoscopy;  Laterality: N/A;   TONSILLECTOMY     At age 69 or 7     Home Medications:  Prior to Admission medications   Medication Sig Start Date End Date Taking? Authorizing Provider  colchicine 0.6 MG tablet Take 1.2mg  (2 tablets) then 0.6mg  (1 tablet) 1 hour after. Then, take 1 tablet every day for 7 days. Patient not taking: Reported on 04/01/2023 10/03/22   Edwin Cap, DPM    Inpatient Medications: Scheduled Meds:  insulin aspart  0-15 Units Subcutaneous TID WC   metoprolol succinate  50 mg Oral Daily   pantoprazole (PROTONIX) IV  40 mg Intravenous QHS   rosuvastatin  20 mg Oral Daily   Continuous Infusions:  clevidipine Stopped (04/01/23 1929)   heparin 1,100 Units/hr (04/02/23 1500)   PRN Meds: acetaminophen **OR** acetaminophen (TYLENOL) oral liquid 160 mg/5 mL **OR**  Cardiology Consultation   Patient ID: Blas Riches MRN: [id number]; DOB: 1961-05-14  Admit date: 04/01/2023 Date of Consult: 04/02/2023  PCP:  Caroline More, MD   [facility] Providers Cardiologist:  None        Patient Profile:   Stephenson Cichy is a 62 y.o. male with a hx of GIST (previously followed at [facility], reportedly transferring care locally due to insurance) who is being seen 04/02/2023 for the evaluation of newly noted afib in the setting of CVA at the request of Dr. Viviann Spare.  History of Present Illness:   Mr. Mounsey presented to the ED early on the morning of 10/7 with acute weakness and tingling of his left arm. Last known normal was around 5am. He described a progression of symptoms from tingling to significant numbness/weakness. CT head inconclusive but with exam positive for acute neuro deficit, patient received TNK. MRI showed small 1.5cm acute cortical and subcortical white matter infarct at superior frontal gyrus with petechial hemorrhage but no malignant hemorrhagic transformation. Today cardiology consulted for management of afib.  On exam, patient reports that his left arm feels "99% back to normal." We discussed atrial fibrillation and its association with increased stroke risk. Patient reports episodes of dizziness with palpitations/irregular heartbeat sensation back in 2018. PCP recommended a heart monitor, but patient reports he had full resolution of symptoms with a diet change and so he declined the monitor. 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 He is unsure if palpitations/irregular rhythm accompanied these episodes.   Today, rates remain elevated, 90s-120s and patient notes that he had more fatigue/shortness of breath than expected while working with PT/OT. He is otherwise without symptoms and denies palpitations, chest pain, dyspnea at rest.  Regarding afib risk  factors, patient's spouse confirms nightly snoring. Patient also reports consumption of about 3 alcoholic drinks per night.   He denies any recent evidence of GI bleeding and reports occasional epistaxis.   Past Medical History:  Diagnosis Date   Alcohol use    Gastric mass    /notes 02/04/2017   Heart palpitations 06/2016   Normocytic anemia 01/2017   Obesity 06/2016   Patient is Jehov[employer]'s Witness    Pleurisy ~ 2008   Refusal of blood transfusions as patient is Jehovah's Witness    Rosacea     Past Surgical History:  Procedure Laterality Date   ABDOMINAL SURGERY     ESOPHAGOGASTRODUODENOSCOPY     ESOPHAGOGASTRODUODENOSCOPY N/A 02/04/2017   Procedure: ESOPHAGOGASTRODUODENOSCOPY (EGD);  Surgeon: Meryl Dare, MD;  Location: [facility] ENDOSCOPY;  Service: Endoscopy;  Laterality: N/A;   TONSILLECTOMY     At age 69 or 7     Home Medications:  Prior to Admission medications   Medication Sig Start Date End Date Taking? Authorizing Provider  colchicine 0.6 MG tablet Take 1.2mg  (2 tablets) then 0.6mg  (1 tablet) 1 hour after. Then, take 1 tablet every day for 7 days. Patient not taking: Reported on 04/01/2023 10/03/22   Edwin Cap, DPM    Inpatient Medications: Scheduled Meds:  insulin aspart  0-15 Units Subcutaneous TID WC   metoprolol succinate  50 mg Oral Daily   pantoprazole (PROTONIX) IV  40 mg Intravenous QHS   rosuvastatin  20 mg Oral Daily   Continuous Infusions:  clevidipine Stopped (04/01/23 1929)   heparin 1,100 Units/hr (04/02/23 1500)   PRN Meds: acetaminophen **OR** acetaminophen (TYLENOL) oral liquid 160 mg/5 mL **OR**

## 2023-04-02 NOTE — Progress Notes (Addendum)
PHARMACY - ANTICOAGULATION CONSULT NOTE  Pharmacy Consult for IV Heparin Indication: atrial fibrillation and stroke  No Known Allergies  Patient Measurements: Height: 5\' 10"  (177.8 cm) Weight: 99 kg (218 lb 4.1 oz) IBW/kg (Calculated) : 73 Heparin Dosing Weight: 93.6 kg  Vital Signs: Temp: 98.5 F (36.9 C) (10/08 1109) Temp Source: Oral (10/08 1109) BP: 158/97 (10/08 1100) Pulse Rate: 98 (10/08 1130)  Labs: Recent Labs    04/01/23 0639  HGB 15.3  13.3  HCT 45.0  39.8  PLT 281  APTT 26  LABPROT 12.6  INR 0.9  CREATININE 0.84  0.80    Estimated Creatinine Clearance: 112.9 mL/min (by C-G formula based on SCr of 0.8 mg/dL).   Medical History: Past Medical History:  Diagnosis Date   Alcohol use    Gastric mass    /notes 02/04/2017   Heart palpitations 06/2016   Normocytic anemia 01/2017   Obesity 06/2016   Patient is Jehovah's Witness    Pleurisy ~ 2008   Refusal of blood transfusions as patient is Jehovah's Witness    Rosacea     Medications:  Scheduled:   aspirin  81 mg Oral Daily   Or   aspirin  300 mg Rectal Daily   insulin aspart  0-15 Units Subcutaneous TID WC   pantoprazole (PROTONIX) IV  40 mg Intravenous QHS   rosuvastatin  20 mg Oral Daily   Infusions:   clevidipine Stopped (04/01/23 1929)    Assessment: 62 year old male with acute ischemic stroke s/p TNK on 10/7 at 0655 AM and new onset atrial fibrillation. Pharmacy consulted to dose IV Heparin therapy before starting oral anticoagulation therapy to determine toleration after discussion with Stroke team.   MRI 10/8 - no hemorrhagic transformation but petechial hemorrhage noted.   CBC is within normal limits. Patient on anti-platelet therapy with low dose aspirin.  Per discussion with team, if tolerates IV Heparin + ASA without concern, then will plan to transition to Pradaxa/other DOAC alone on 10/9 per Stroke best practice guidance.   Goal of Therapy:  Heparin level 0.3-0.7  units/ml Monitor platelets by anticoagulation protocol: Yes   Plan:  No bolus. Start heparin infusion at 1100 units/hr Check anti-Xa level in 6 hours and daily while on heparin Continue to monitor H&H and platelets Monitor neuro checks and for any signs of symptoms of bleeding.  Follow-up tomorrow to transition to DOAC if no issues.   Link Snuffer, PharmD, BCPS, BCCCP Please refer to [facility] for [facility] Pharmacy numbers 04/02/2023,12:49 PM

## 2023-04-02 NOTE — Evaluation (Signed)
Occupational Therapy Evaluation Patient Details Name: Samuel Clarke MRN: [id number] DOB: [birth year] Today's Date: 04/02/2023   History of Present Illness 62 y.o. male admitted 04/01/23 as a code stroke due to ataxia, weakness and tingling of LUE. 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 MRI 10/8:small 1.5 cm Acute cortical and subcortical white matter infarct at the right superior frontal gyrus. PMHx: GIST, ETOH abuse, obesity.   Clinical Impression   PTA, pt lived with wife and was independent in ADL, IADL, working, and driving. Upon eval, pt presenting with LUE decr coordination proximally>distally. Pt with decr precision with reaching task, dysdiadokinesia, and finger to nose. Due to nature of his work, recommending continued OP OT after discharge.       If plan is discharge home, recommend the following: A little help with walking and/or transfers;A little help with bathing/dressing/bathroom    Functional Status Assessment  Patient has had a recent decline in their functional status and demonstrates the ability to make significant improvements in function in a reasonable and predictable amount of time.  Equipment Recommendations  None recommended by OT    Recommendations for Other Services       Precautions / Restrictions Precautions Precautions: Other (comment) Precaution Comments: watch HR Restrictions Weight Bearing Restrictions: No      Mobility Bed Mobility Overal bed mobility: Independent                  Transfers Overall transfer level: Independent                        Balance Overall balance assessment: No apparent balance deficits (not formally assessed)                                         ADL either performed or assessed with clinical judgement   ADL Overall ADL's : Independent                                             Vision Baseline Vision/History: 1 Wears  glasses Ability to See in Adequate Light: 0 Adequate Patient Visual Report: No change from baseline Vision Assessment?: No apparent visual deficits;Wears glasses for driving;Wears glasses for reading     Perception Perception: Within Functional Limits       Praxis Praxis: [facility]       Pertinent Vitals/Pain Pain Assessment Pain Assessment: No/denies pain     Extremity/Trunk Assessment Upper Extremity Assessment Upper Extremity Assessment: Right hand dominant;LUE deficits/detail LUE Deficits / Details: shoulder flexion and abduction 4/5, elbow flexion/extension 5/5. good grip. decreased precision, control and speed of finger to nose, finger to nose and then therapist finger, dysdiadokinesia   Lower Extremity Assessment Lower Extremity Assessment: Overall WFL for tasks assessed   Cervical / Trunk Assessment Cervical / Trunk Assessment: Normal   Communication Communication Communication: No apparent difficulties   Cognition Arousal: Alert Behavior During Therapy: WFL for tasks assessed/performed Overall Cognitive Status: Within Functional Limits for tasks assessed                                 General Comments: Able to follow 5 step commands consistently     General  Comments  HR 100-148 fluctuating throughout with mobility and at rest    Exercises     Shoulder Instructions      Home Living Family/patient expects to be discharged to:: Private residence Living Arrangements: Spouse/significant other Available Help at Discharge: Family;Available 24 hours/day Type of Home: House Home Access: Stairs to enter [employer] of Steps: 2   Home Layout: Two level;Able to live on main level with bedroom/bathroom     Bathroom Shower/Tub: Chief Strategy Officer: Handicapped height     Home Equipment: None      Lives With: Spouse    Prior Functioning/Environment Prior Level of Function : Independent/Modified  Independent;Driving;Working/employed             Mobility Comments: residential window washer, independent          OT Problem List: Decreased coordination;Decreased strength      OT Treatment/Interventions: Self-care/ADL training;Therapeutic exercise;DME and/or AE instruction;Patient/family education;Therapeutic activities    OT Goals(Current goals can be found in the care plan section) Acute Rehab OT Goals Patient Stated Goal: go home/get better OT Goal Formulation: With patient Time For Goal Achievement: 04/16/23 Potential to Achieve Goals: Good  OT Frequency: Min 1X/week    Co-evaluation              AM-PAC OT "6 Clicks" Daily Activity     Outcome Measure Help from another person eating meals?: None Help from another person taking care of personal grooming?: None Help from another person toileting, which includes using toliet, bedpan, or urinal?: None Help from another person bathing (including washing, rinsing, drying)?: None Help from another person to put on and taking off regular upper body clothing?: None Help from another person to put on and taking off regular lower body clothing?: None 6 Click Score: 24   End of Session Nurse Communication: Mobility status  Activity Tolerance: Patient tolerated treatment well Patient left: in chair;with call bell/phone within reach;with family/visitor present  OT Visit Diagnosis: Muscle weakness (generalized) (M62.81)                Time: 4010-2725 OT Time Calculation (min): 24 min Charges:  OT General Charges $OT Visit: 1 Visit OT Evaluation $OT Eval Low Complexity: 1 Low OT Treatments $Self Care/Home Management : 8-22 mins  Myrla Halsted, OTD, OTR/L [facility] Acute Rehabilitation Office: [phone]   Myrla Halsted 04/02/2023, 2:11 PM

## 2023-04-02 NOTE — Plan of Care (Signed)
Problem: Education: Goal: Knowledge of disease or condition will improve 04/02/2023 0749 by Aretta Nip, RN Outcome: Progressing 04/02/2023 0748 by Aretta Nip, RN Outcome: Progressing Goal: Knowledge of secondary prevention will improve (MUST DOCUMENT ALL) 04/02/2023 0749 by Aretta Nip, RN Outcome: Progressing 04/02/2023 0748 by Aretta Nip, RN Outcome: Progressing Goal: Knowledge of patient specific risk factors will improve Samuel Clarke N/A or DELETE if not current risk factor) 04/02/2023 0749 by Aretta Nip, RN Outcome: Progressing 04/02/2023 0748 by Aretta Nip, RN Outcome: Progressing   Problem: Ischemic Stroke/TIA Tissue Perfusion: Goal: Complications of ischemic stroke/TIA will be minimized 04/02/2023 0749 by Aretta Nip, RN Outcome: Progressing 04/02/2023 0748 by Aretta Nip, RN Outcome: Progressing   Problem: Coping: Goal: Will verbalize positive feelings about self 04/02/2023 0749 by Aretta Nip, RN Outcome: Progressing 04/02/2023 0748 by Aretta Nip, RN Outcome: Progressing Goal: Will identify appropriate support needs 04/02/2023 0749 by Aretta Nip, RN Outcome: Progressing 04/02/2023 0748 by Aretta Nip, RN Outcome: Progressing   Problem: Health Behavior/Discharge Planning: Goal: Ability to manage health-related needs will improve 04/02/2023 0749 by Aretta Nip, RN Outcome: Progressing 04/02/2023 0748 by Aretta Nip, RN Outcome: Progressing Goal: Goals will be collaboratively established with patient/family 04/02/2023 0749 by Aretta Nip, RN Outcome: Progressing 04/02/2023 0748 by Aretta Nip, RN Outcome: Progressing   Problem: Self-Care: Goal: Ability to participate in self-care as condition permits will improve 04/02/2023 0749 by Aretta Nip, RN Outcome: Progressing 04/02/2023 0748 by Aretta Nip, RN Outcome: Progressing Goal: Verbalization of feelings and concerns over difficulty with self-care  will improve 04/02/2023 0749 by Aretta Nip, RN Outcome: Progressing 04/02/2023 0748 by Aretta Nip, RN Outcome: Progressing Goal: Ability to communicate needs accurately will improve 04/02/2023 0749 by Aretta Nip, RN Outcome: Progressing 04/02/2023 0748 by Aretta Nip, RN Outcome: Progressing   Problem: Nutrition: Goal: Risk of aspiration will decrease 04/02/2023 0749 by Aretta Nip, RN Outcome: Progressing 04/02/2023 0748 by Aretta Nip, RN Outcome: Progressing Goal: Dietary intake will improve 04/02/2023 0749 by Aretta Nip, RN Outcome: Progressing 04/02/2023 0748 by Aretta Nip, RN Outcome: Progressing   Problem: Education: Goal: Knowledge of General Education information will improve Description: Including pain rating scale, medication(s)/side effects and non-pharmacologic comfort measures 04/02/2023 0749 by Aretta Nip, RN Outcome: Progressing 04/02/2023 0748 by Aretta Nip, RN Outcome: Progressing   Problem: Health Behavior/Discharge Planning: Goal: Ability to manage health-related needs will improve 04/02/2023 0749 by Aretta Nip, RN Outcome: Progressing 04/02/2023 0748 by Aretta Nip, RN Outcome: Progressing   Problem: Clinical Measurements: Goal: Ability to maintain clinical measurements within normal limits will improve 04/02/2023 0749 by Aretta Nip, RN Outcome: Progressing 04/02/2023 0748 by Aretta Nip, RN Outcome: Progressing Goal: Will remain free from infection 04/02/2023 0749 by Aretta Nip, RN Outcome: Progressing 04/02/2023 0748 by Aretta Nip, RN Outcome: Progressing Goal: Diagnostic test results will improve 04/02/2023 0749 by Aretta Nip, RN Outcome: Progressing 04/02/2023 0748 by Aretta Nip, RN Outcome: Progressing Goal: Respiratory complications will improve 04/02/2023 0749 by Aretta Nip, RN Outcome: Progressing 04/02/2023 0748 by Aretta Nip, RN Outcome:  Progressing Goal: Cardiovascular complication will be avoided 04/02/2023 0749 by Aretta Nip, RN Outcome: Progressing 04/02/2023 0748 by Aretta Nip, RN Outcome: Progressing   Problem: Activity: Goal: Risk for activity intolerance will decrease 04/02/2023 0749 by Aretta Nip, RN Outcome: Not  Progressing Note: Patient on bedrest at this time 04/02/2023 0748 by Aretta Nip, RN Outcome: Progressing   Problem: Nutrition: Goal: Adequate nutrition will be maintained 04/02/2023 0749 by Aretta Nip, RN Outcome: Progressing 04/02/2023 0748 by Aretta Nip, RN Outcome: Progressing   Problem: Coping: Goal: Level of anxiety will decrease 04/02/2023 0749 by Aretta Nip, RN Outcome: Progressing 04/02/2023 0748 by Aretta Nip, RN Outcome: Progressing   Problem: Elimination: Goal: Will not experience complications related to bowel motility 04/02/2023 0749 by Aretta Nip, RN Outcome: Progressing 04/02/2023 0748 by Aretta Nip, RN Outcome: Progressing Goal: Will not experience complications related to urinary retention 04/02/2023 0749 by Aretta Nip, RN Outcome: Progressing 04/02/2023 0748 by Aretta Nip, RN Outcome: Progressing   Problem: Pain Managment: Goal: General experience of comfort will improve 04/02/2023 0749 by Aretta Nip, RN Outcome: Progressing 04/02/2023 0748 by Aretta Nip, RN Outcome: Progressing   Problem: Safety: Goal: Ability to remain free from injury will improve 04/02/2023 0749 by Aretta Nip, RN Outcome: Progressing 04/02/2023 0748 by Aretta Nip, RN Outcome: Progressing   Problem: Skin Integrity: Goal: Risk for impaired skin integrity will decrease 04/02/2023 0749 by Aretta Nip, RN Outcome: Progressing 04/02/2023 0748 by Aretta Nip, RN Outcome: Progressing   Problem: Education: Goal: Knowledge of disease or condition will improve Outcome: Not Progressing Note: New diagnosis of afib Goal:  Understanding of medication regimen will improve Outcome: Not Progressing Goal: Individualized Educational Video(s) Outcome: Not Progressing   Problem: Activity: Goal: Ability to tolerate increased activity will improve Outcome: Not Progressing   Problem: Cardiac: Goal: Ability to achieve and maintain adequate cardiopulmonary perfusion will improve Outcome: Progressing   Problem: Health Behavior/Discharge Planning: Goal: Ability to safely manage health-related needs after discharge will improve Outcome: Progressing

## 2023-04-02 NOTE — Progress Notes (Addendum)
0730 patient alert able to make all needs known on room air wife at bedside. Left arm slower with coordination than right but strength is good no other deficits noted.Plan for MRI at 0830. 0810 patient taken to MRI 0900 returned from MRI 1000 stroke teaching and handbook given to patient  1130 patient heart rate goes to 140s with ambulation patient asymptomatic patient able to provide own ADL care 1200 AFIB teaching and printouts given to patient 1430 heparin started 1500 metoprolol PO started bath completed

## 2023-04-02 NOTE — Plan of Care (Signed)
  Problem: Education: Goal: Ability to describe self-care measures that may prevent or decrease complications (Diabetes Survival Skills Education) will improve Outcome: Progressing Goal: Individualized Educational Video(s) Outcome: Progressing   

## 2023-04-02 NOTE — Progress Notes (Signed)
PT Cancellation Note  Patient Details Name: Samuel Clarke MRN: [id number] DOB: [birth year]   Cancelled Treatment:    Reason Eval/Treat Not Completed: Active bedrest order   Enedina Finner [place] 04/02/2023, 6:45 AM Merryl Hacker, PT Acute Rehabilitation Services Office: [phone]

## 2023-04-02 NOTE — Evaluation (Signed)
Speech Language Pathology Evaluation Patient Details Name: Samuel Clarke MRN: [id number] DOB: 1960-07-06 Today's Date: 04/02/2023 Time: 7829-5621 SLP Time Calculation (min) (ACUTE ONLY): 15 min  Problem List:  Patient Active Problem List   Diagnosis Date Noted   Acute ischemic stroke (HCC) 04/01/2023   Hyponatremia 02/04/2017   Normocytic anemia 02/04/2017   Leukocytosis 02/04/2017   Alcohol use 02/04/2017   Patient is Jehovah's Witness 02/04/2017   Gastric mass 02/04/2017   Chest pain 02/04/2017   Abdominal pain 02/04/2017   Abnormal CT scan, stomach    Past Medical History:  Past Medical History:  Diagnosis Date   Alcohol use    Gastric mass    /notes 02/04/2017   Heart palpitations 06/2016   Normocytic anemia 01/2017   Obesity 06/2016   Patient is Jehovah's Witness    Pleurisy ~ 2008   Refusal of blood transfusions as patient is Jehovah's Witness    Rosacea    Past Surgical History:  Past Surgical History:  Procedure Laterality Date   ABDOMINAL SURGERY     ESOPHAGOGASTRODUODENOSCOPY     ESOPHAGOGASTRODUODENOSCOPY N/A 02/04/2017   Procedure: ESOPHAGOGASTRODUODENOSCOPY (EGD);  Surgeon: Meryl Dare, MD;  Location[facility] ENDOSCOPY;  Service: Endoscopy;  Laterality: N/A;   TONSILLECTOMY     At age 39 or 22   HPI:  Patient is a 62 y.o. male who presented to ED on 04/01/23 for sudden onset of weakness and tingling of left arm. Code stroke was activated upon examination revealing ataxia. MR brain on 10/08 revealed a small 1.5 cm acute cortical and subcortical white matter infarct. PMH is significant for GIST, ETOH abuse, and heart palpitations.   Assessment / Plan / Recommendation Clinical Impression  Patient was seen by SLP for cognitive-linguistic evaluation. He was awake, alert and cooperative throughout the evaluation. His family was at bedside. Cognitive-linguistic skills were evaluated using the Cognistat test of cognitive status. Patient presents with no  cognitive-linguistic impairments at this time. He was orientated X4, and demonstrated skills WFL in naming, memory, comprehension, repetition, judgement, and reasoning. He had no complaints of acute changes in speech or language. His spouse endorsed no observable deficits. No further ST services recommended at this time.    SLP Assessment  SLP Recommendation/Assessment: Patient does not need any further Speech Lanaguage Pathology Services SLP Visit Diagnosis: Aphasia (R47.01);Cognitive communication deficit (R41.841)    Recommendations for follow up therapy are one component of a multi-disciplinary discharge planning process, led by the attending physician.  Recommendations may be updated based on patient status, additional functional criteria and insurance authorization.    Follow Up Recommendations  No SLP follow up    Assistance Recommended at Discharge  None  Functional Status Assessment Patient has not had a recent decline in their functional status  Frequency and Duration           SLP Evaluation Cognition  Overall Cognitive Status: Within Functional Limits for tasks assessed Arousal/Alertness: Awake/alert Orientation Level: Oriented X4 Memory: Appears intact Awareness: Appears intact Problem Solving: Appears intact Safety/Judgment: Appears intact       Comprehension  Auditory Comprehension Overall Auditory Comprehension: Appears within functional limits for tasks assessed Yes/No Questions: Not tested Commands: Within Functional Limits Conversation: Complex Visual Recognition/Discrimination Discrimination: Within Function Limits Reading Comprehension Reading Status: Not tested    Expression Expression Primary Mode of Expression: Verbal Verbal Expression Overall Verbal Expression: Appears within functional limits for tasks assessed Initiation: No impairment Level of Generative/Spontaneous Verbalization: Conversation Repetition: No impairment Naming: No  impairment  Pragmatics: No impairment   Oral / Motor  Oral Motor/Sensory Function Overall Oral Motor/Sensory Function: Within functional limits Motor Speech Overall Motor Speech: Appears within functional limits for tasks assessed Respiration: Within functional limits Phonation: Normal Resonance: Within functional limits Articulation: Within functional limitis Intelligibility: Intelligible Motor Planning: Witnin functional limits Motor Speech Errors: Not applicable            Marline Backbone, B.S., Speech Therapy Student   04/02/2023, 11:09 AM

## 2023-04-02 NOTE — TOC Benefit Eligibility Note (Signed)
Patient Product/process development scientist completed.    The patient is insured through [employer]. Patient has [employer], may use a copay card, and/or apply for patient assistance if available.    Ran test claim for Eliquis 5 mg and the current 30 day co-pay is $567.26 due to a $4500 deductilbe.  Ran test claim for Xarelto 20 mg and the current 30 day co-pay is $543.58 due to a $4500 deductilbe.  Ran test claim for Pradaxa 150 mg (Brand and Generic) and Not on Formulary  This test claim was processed through [facility]- copay amounts may vary at other pharmacies due to [employer], or as the patient moves through the different stages of their insurance plan.     Roland Earl, CPHT Pharmacy Technician III Certified Patient Advocate [facility] Patient Advocate Team Direct Number: [phone]  Fax: [phone]

## 2023-04-02 NOTE — Evaluation (Addendum)
Physical Therapy Evaluation/ Discharge Patient Details Name: Samuel Clarke MRN: [id number] DOB: August 21, 1960 Today's Date: 04/02/2023  History of Present Illness  62 y.o. male admitted 04/01/23 as a code stroke due to ataxia, weakness and tingling of LUE. 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 MRI 10/8:small 1.5 cm Acute cortical and subcortical white matter infarct at the right superior frontal gyrus. PMHx: GIST, ETOH abuse, obesity.  Clinical Impression  Pt pleasant with limited strength and coordination deficits of LUE with pt reporting improvement from admission. 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 Pt with independence with transfers, gait, stairs and balance as noted during session. Pt and family educated for BE FAST and all questions answered. Encouraged continued ambulation and RN aware of afib and rate during gait. No further acute therapy needs, will defer to OT.   HR 93-148 SPO2 96% RA BP 158/97 (116) pre gait 158/101 (120) post gait        If plan is discharge home, recommend the following:     Can travel by private vehicle        Equipment Recommendations None recommended by PT  Recommendations for Other Services  OT consult    Functional Status Assessment Patient has not had a recent decline in their functional status     Precautions / Restrictions Precautions Precautions: Other (comment) Precaution Comments: watch HR      Mobility  Bed Mobility Overal bed mobility: Independent                  Transfers Overall transfer level: Independent                      Ambulation/Gait Ambulation/Gait assistance: Independent Gait Distance (Feet): 400 Feet Assistive device: None Gait Pattern/deviations: WFL(Within Functional Limits)   Gait velocity interpretation: >4.37 ft/sec, indicative of normal walking speed   General Gait Details: HR 98-148 with Afib during gait with pt  stating fatigue with stairs and end of gait requiring standing rest to recover. no gait deficits with pt able to turn head during gait and talk and walk  Stairs Stairs: Yes Stairs assistance: Modified independent (Device/Increase time) Stair Management: Forwards, Alternating pattern, One rail Left Number of Stairs: 11    Wheelchair Mobility     Tilt Bed    Modified Rankin (Stroke Patients Only) Modified Rankin (Stroke Patients Only) Pre-Morbid Rankin Score: No symptoms Modified Rankin: No significant disability     Balance Overall balance assessment: No apparent balance deficits (not formally assessed)                                           Pertinent Vitals/Pain Pain Assessment Pain Assessment: No/denies pain    Home Living Family/patient expects to be discharged to:: Private residence Living Arrangements: Spouse/significant other Available Help at Discharge: Family;Available 24 hours/day Type of Home: House Home Access: Stairs to enter   [employer] of Steps: 2   Home Layout: Two level;Able to live on main level with bedroom/bathroom Home Equipment: None      Prior Function Prior Level of Function : Independent/Modified Independent;Driving;Working/employed             Mobility Comments: residential window washer, independent       Extremity/Trunk Assessment   Upper Extremity Assessment Upper Extremity Assessment: LUE deficits/detail LUE Deficits /  Details: shoulder flexion and abduction 3+/5, elbow flexion/extension 5/5. good grip. slightly decreased control and speed of finger to nose    Lower Extremity Assessment Lower Extremity Assessment: Overall WFL for tasks assessed    Cervical / Trunk Assessment Cervical / Trunk Assessment: Normal  Communication   Communication Communication: No apparent difficulties  Cognition Arousal: Alert Behavior During Therapy: WFL for tasks assessed/performed Overall Cognitive  Status: Within Functional Limits for tasks assessed                                          General Comments      Exercises     Assessment/Plan    PT Assessment Patient does not need any further PT services  PT Problem List         PT Treatment Interventions      PT Goals (Current goals can be found in the Care Plan section)  Acute Rehab PT Goals PT Goal Formulation: All assessment and education complete, DC therapy    Frequency       Co-evaluation               AM-PAC PT "6 Clicks" Mobility  Outcome Measure Help needed turning from your back to your side while in a flat bed without using bedrails?: None Help needed moving from lying on your back to sitting on the side of a flat bed without using bedrails?: None Help needed moving to and from a bed to a chair (including a wheelchair)?: None Help needed standing up from a chair using your arms (e.g., wheelchair or bedside chair)?: None Help needed to walk in hospital room?: None Help needed climbing 3-5 steps with a railing? : None 6 Click Score: 24    End of Session   Activity Tolerance: Patient tolerated treatment well Patient left: in chair;with call bell/phone within reach;with family/visitor present Nurse Communication: Mobility status PT Visit Diagnosis: Muscle weakness (generalized) (M62.81)    Time: 2841-3244 PT Time Calculation (min) (ACUTE ONLY): 19 min   Charges:   PT Evaluation $PT Eval Low Complexity: 1 Low   PT General Charges $$ ACUTE PT VISIT: 1 Visit         Merryl Hacker, PT Acute Rehabilitation Services Office: [phone]   Enedina Finner [name] 04/02/2023, 11:35 AM

## 2023-04-03 ENCOUNTER — Other Ambulatory Visit (HOSPITAL_COMMUNITY): Payer: Self-pay

## 2023-04-03 DIAGNOSIS — E785 Hyperlipidemia, unspecified: Secondary | ICD-10-CM | POA: Diagnosis present

## 2023-04-03 DIAGNOSIS — I4891 Unspecified atrial fibrillation: Secondary | ICD-10-CM | POA: Diagnosis not present

## 2023-04-03 DIAGNOSIS — I10 Essential (primary) hypertension: Secondary | ICD-10-CM | POA: Diagnosis present

## 2023-04-03 DIAGNOSIS — I4819 Other persistent atrial fibrillation: Secondary | ICD-10-CM | POA: Diagnosis present

## 2023-04-03 DIAGNOSIS — E669 Obesity, unspecified: Secondary | ICD-10-CM | POA: Insufficient documentation

## 2023-04-03 DIAGNOSIS — E119 Type 2 diabetes mellitus without complications: Secondary | ICD-10-CM

## 2023-04-03 DIAGNOSIS — I639 Cerebral infarction, unspecified: Secondary | ICD-10-CM | POA: Diagnosis not present

## 2023-04-03 LAB — CBC
HCT: 40.8 % (ref 39.0–52.0)
Hemoglobin: 13.4 g/dL (ref 13.0–17.0)
MCH: 28.7 pg (ref 26.0–34.0)
MCHC: 32.8 g/dL (ref 30.0–36.0)
MCV: 87.4 fL (ref 80.0–100.0)
Platelets: 264 10*3/uL (ref 150–400)
RBC: 4.67 MIL/uL (ref 4.22–5.81)
RDW: 15.4 % (ref 11.5–15.5)
WBC: 10.7 10*3/uL — ABNORMAL HIGH (ref 4.0–10.5)
nRBC: 0 % (ref 0.0–0.2)

## 2023-04-03 LAB — GLUCOSE, CAPILLARY
Glucose-Capillary: 112 mg/dL — ABNORMAL HIGH (ref 70–99)
Glucose-Capillary: 125 mg/dL — ABNORMAL HIGH (ref 70–99)

## 2023-04-03 LAB — HEPARIN LEVEL (UNFRACTIONATED): Heparin Unfractionated: 0.76 [IU]/mL — ABNORMAL HIGH (ref 0.30–0.70)

## 2023-04-03 MED ORDER — APIXABAN 5 MG PO TABS
5.0000 mg | ORAL_TABLET | Freq: Two times a day (BID) | ORAL | Status: DC
  Administered 2023-04-03: 5 mg via ORAL
  Filled 2023-04-03: qty 1

## 2023-04-03 MED ORDER — APIXABAN 5 MG PO TABS
5.0000 mg | ORAL_TABLET | Freq: Two times a day (BID) | ORAL | 0 refills | Status: DC
Start: 2023-04-03 — End: 2023-04-23
  Filled 2023-04-03: qty 60, 30d supply, fill #0

## 2023-04-03 MED ORDER — METOPROLOL SUCCINATE ER 50 MG PO TB24
50.0000 mg | ORAL_TABLET | Freq: Every day | ORAL | 0 refills | Status: DC
  Filled 2023-04-03: qty 30, 30d supply, fill #0

## 2023-04-03 MED ORDER — ROSUVASTATIN CALCIUM 20 MG PO TABS
20.0000 mg | ORAL_TABLET | Freq: Every day | ORAL | 0 refills | Status: DC
  Filled 2023-04-03: qty 30, 30d supply, fill #0

## 2023-04-03 NOTE — Progress Notes (Signed)
Cardiology Progress Note  Patient ID: Tejas Seawood MRN: [id number] DOB: Nov 10, 1960 Date of Encounter: 04/03/2023  Primary Cardiologist: None  Subjective   Chief Complaint: none.   HPI: A-fib rates improved.  Still on heparin drip.  No deficits from stroke.  ROS:  All other ROS reviewed and negative. Pertinent positives noted in the HPI.     Inpatient Medications  Scheduled Meds:  influenza vac split trivalent PF  0.5 mL Intramuscular Tomorrow-1000   insulin aspart  0-15 Units Subcutaneous TID WC   metoprolol succinate  50 mg Oral Daily   pantoprazole (PROTONIX) IV  40 mg Intravenous QHS   pneumococcal 20-valent conjugate vaccine  0.5 mL Intramuscular Tomorrow-1000   rosuvastatin  20 mg Oral Daily   Continuous Infusions:  clevidipine Stopped (04/01/23 1929)   heparin Stopped (04/03/23 0759)   PRN Meds: acetaminophen **OR** acetaminophen (TYLENOL) oral liquid 160 mg/5 mL **OR** acetaminophen, mouth rinse, senna-docusate   Vital Signs   Vitals:   04/03/23 0341 04/03/23 0400 04/03/23 0500 04/03/23 0738  BP:  135/83    Pulse:  79 81   Resp:  15 12   Temp: 98 F (36.7 C)   98.6 F (37 C)  TempSrc: Oral   Oral  SpO2:  96% 94%   Weight:      Height:        Intake/Output Summary (Last 24 hours) at 04/03/2023 0918 Last data filed at 04/03/2023 0800 Gross per 24 hour  Intake 504.34 ml  Output --  Net 504.34 ml      04/01/2023    6:33 AM 02/04/2017    1:00 PM  Last 3 Weights  Weight (lbs) 218 lb 4.1 oz 216 lb  Weight (kg) 99 kg 97.977 kg      Telemetry  Overnight telemetry shows Afib 90-110 bpm, which I personally reviewed.    Physical Exam   Vitals:   04/03/23 0341 04/03/23 0400 04/03/23 0500 04/03/23 0738  BP:  135/83    Pulse:  79 81   Resp:  15 12   Temp: 98 F (36.7 C)   98.6 F (37 C)  TempSrc: Oral   Oral  SpO2:  96% 94%   Weight:      Height:        Intake/Output Summary (Last 24 hours) at 04/03/2023 0918 Last data filed at 04/03/2023  0800 Gross per 24 hour  Intake 504.34 ml  Output --  Net 504.34 ml       04/01/2023    6:33 AM 02/04/2017    1:00 PM  Last 3 Weights  Weight (lbs) 218 lb 4.1 oz 216 lb  Weight (kg) 99 kg 97.977 kg    Body mass index is 31.32 kg/m.  General: Well nourished, well developed, in no acute distress Head: Atraumatic, normal size  Eyes: PEERLA, EOMI  Neck: Supple, no JVD Endocrine: No thryomegaly Cardiac: Normal S1, S2; irregular rhythm Lungs: Clear to auscultation bilaterally, no wheezing, rhonchi or rales  Abd: Soft, nontender, no hepatomegaly  Ext: No edema, pulses 2+ Musculoskeletal: No deformities, BUE and BLE strength normal and equal Skin: Warm and dry, no rashes   Neuro: Alert and oriented to person, place, time, and situation, CNII-XII grossly intact, no focal deficits  Psych: Normal mood and affect   Labs  High Sensitivity Troponin:  No results for input(s): "TROPONINIHS" in the last 720 hours.   Cardiac EnzymesNo results for input(s): "TROPONINI" in the last 168 hours. No results for input(s): "TROPIPOC" in the  Cardiology Progress Note  Patient ID: Tejas Seawood MRN: [id number] DOB: Nov 10, 1960 Date of Encounter: 04/03/2023  Primary Cardiologist: None  Subjective   Chief Complaint: none.   HPI: A-fib rates improved.  Still on heparin drip.  No deficits from stroke.  ROS:  All other ROS reviewed and negative. Pertinent positives noted in the HPI.     Inpatient Medications  Scheduled Meds:  influenza vac split trivalent PF  0.5 mL Intramuscular Tomorrow-1000   insulin aspart  0-15 Units Subcutaneous TID WC   metoprolol succinate  50 mg Oral Daily   pantoprazole (PROTONIX) IV  40 mg Intravenous QHS   pneumococcal 20-valent conjugate vaccine  0.5 mL Intramuscular Tomorrow-1000   rosuvastatin  20 mg Oral Daily   Continuous Infusions:  clevidipine Stopped (04/01/23 1929)   heparin Stopped (04/03/23 0759)   PRN Meds: acetaminophen **OR** acetaminophen (TYLENOL) oral liquid 160 mg/5 mL **OR** acetaminophen, mouth rinse, senna-docusate   Vital Signs   Vitals:   04/03/23 0341 04/03/23 0400 04/03/23 0500 04/03/23 0738  BP:  135/83    Pulse:  79 81   Resp:  15 12   Temp: 98 F (36.7 C)   98.6 F (37 C)  TempSrc: Oral   Oral  SpO2:  96% 94%   Weight:      Height:        Intake/Output Summary (Last 24 hours) at 04/03/2023 0918 Last data filed at 04/03/2023 0800 Gross per 24 hour  Intake 504.34 ml  Output --  Net 504.34 ml      04/01/2023    6:33 AM 02/04/2017    1:00 PM  Last 3 Weights  Weight (lbs) 218 lb 4.1 oz 216 lb  Weight (kg) 99 kg 97.977 kg      Telemetry  Overnight telemetry shows Afib 90-110 bpm, which I personally reviewed.    Physical Exam   Vitals:   04/03/23 0341 04/03/23 0400 04/03/23 0500 04/03/23 0738  BP:  135/83    Pulse:  79 81   Resp:  15 12   Temp: 98 F (36.7 C)   98.6 F (37 C)  TempSrc: Oral   Oral  SpO2:  96% 94%   Weight:      Height:        Intake/Output Summary (Last 24 hours) at 04/03/2023 0918 Last data filed at 04/03/2023  0800 Gross per 24 hour  Intake 504.34 ml  Output --  Net 504.34 ml       04/01/2023    6:33 AM 02/04/2017    1:00 PM  Last 3 Weights  Weight (lbs) 218 lb 4.1 oz 216 lb  Weight (kg) 99 kg 97.977 kg    Body mass index is 31.32 kg/m.  General: Well nourished, well developed, in no acute distress Head: Atraumatic, normal size  Eyes: PEERLA, EOMI  Neck: Supple, no JVD Endocrine: No thryomegaly Cardiac: Normal S1, S2; irregular rhythm Lungs: Clear to auscultation bilaterally, no wheezing, rhonchi or rales  Abd: Soft, nontender, no hepatomegaly  Ext: No edema, pulses 2+ Musculoskeletal: No deformities, BUE and BLE strength normal and equal Skin: Warm and dry, no rashes   Neuro: Alert and oriented to person, place, time, and situation, CNII-XII grossly intact, no focal deficits  Psych: Normal mood and affect   Labs  High Sensitivity Troponin:  No results for input(s): "TROPONINIHS" in the last 720 hours.   Cardiac EnzymesNo results for input(s): "TROPONINI" in the last 168 hours. No results for input(s): "TROPIPOC" in the  last 168 hours.  Chemistry Recent Labs  Lab 04/01/23 0639  NA 135  136  K 4.2  4.2  CL 97*  101  CO2 22  GLUCOSE 148*  146*  BUN 9  9  CREATININE 0.84  0.80  CALCIUM 8.7*  PROT 7.1  ALBUMIN 3.7  AST 37  ALT 36  ALKPHOS 62  BILITOT 0.4  GFRNONAA >60  ANIONGAP 16*    Hematology Recent Labs  Lab 04/01/23 0639 04/03/23 0222  WBC 8.4 10.7*  RBC 4.57 4.67  HGB 15.3  13.3 13.4  HCT 45.0  39.8 40.8  MCV 87.1 87.4  MCH 29.1 28.7  MCHC 33.4 32.8  RDW 15.5 15.4  PLT 281 264   BNPNo results for input(s): "BNP", "PROBNP" in the last 168 hours.  DDimer No results for input(s): "DDIMER" in the last 168 hours.   Radiology  MR BRAIN WO CONTRAST  Result Date: 04/02/2023 CLINICAL DATA:  62 year old male code stroke presentation yesterday. Left upper extremity tingling. Status post T NK. EXAM: MRI HEAD WITHOUT CONTRAST TECHNIQUE: Multiplanar,  multiecho pulse sequences of the brain and surrounding structures were obtained without intravenous contrast. COMPARISON:  Head CT and CTA head and neck yesterday. FINDINGS: Brain: Right superior frontal gyrus, motor or pre motor cortex and subcortical white matter restricted diffusion on series 2, image 40, series 3, image 19. This is about 15 mm. T2 and FLAIR hyperintensity associated (series 6, image 28) with petechial hemorrhage on series 7, image 75. No mass effect. No other diffusion restriction. Small area of chronic cortical encephalomalacia in the right occipital pole on series 6, image 13. Possibly mild hemosiderin there also. No midline shift, mass effect, evidence of mass lesion, ventriculomegaly, extra-axial collection or acute intracranial hemorrhage. Cervicomedullary junction and pituitary are within normal limits. Outside of the above findings there is scattered mild white matter T2 and FLAIR hyperintensity which is more often in the right hemisphere. The deep gray nuclei, brainstem, and cerebellum appear normal. No other chronic cerebral blood products. Vascular: Major intracranial vascular flow voids are preserved. Skull and upper cervical spine: Negative for age visible cervical spine. Visualized bone marrow signal is within normal limits. Sinuses/Orbits: Negative. Other: Negative visible internal auditory structures. Trace left mastoid fluid. Negative visible nasopharynx, scalp and face. IMPRESSION: 1. Positive for a small 1.5 cm Acute cortical and subcortical white matter infarct at the right superior frontal gyrus - motor or pre motor area. Petechial hemorrhage (Heidelberg classification 1a: HI1), but no malignant hemorrhagic transformation or mass effect. But no mass effect. 2. No other acute intracranial abnormality. Small chronic right occipital cortical infarct also with questionable hemosiderin. Otherwise mild for age white matter signal changes. Electronically Signed   By: Odessa Fleming M.D.    On: 04/02/2023 09:43   ECHOCARDIOGRAM COMPLETE  Result Date: 04/01/2023    ECHOCARDIOGRAM REPORT   Patient Name:   Aleksander Bures Date of Exam: 04/01/2023 Medical Rec #:  [id number]       Height:       70.0 in Accession #:    [id number]      Weight:       218.3 lb Date of Birth:  06-18-1961       BSA:          2.166 m Patient Age:    62 years        BP:           120/70 mmHg Patient Gender: M  last 168 hours.  Chemistry Recent Labs  Lab 04/01/23 0639  NA 135  136  K 4.2  4.2  CL 97*  101  CO2 22  GLUCOSE 148*  146*  BUN 9  9  CREATININE 0.84  0.80  CALCIUM 8.7*  PROT 7.1  ALBUMIN 3.7  AST 37  ALT 36  ALKPHOS 62  BILITOT 0.4  GFRNONAA >60  ANIONGAP 16*    Hematology Recent Labs  Lab 04/01/23 0639 04/03/23 0222  WBC 8.4 10.7*  RBC 4.57 4.67  HGB 15.3  13.3 13.4  HCT 45.0  39.8 40.8  MCV 87.1 87.4  MCH 29.1 28.7  MCHC 33.4 32.8  RDW 15.5 15.4  PLT 281 264   BNPNo results for input(s): "BNP", "PROBNP" in the last 168 hours.  DDimer No results for input(s): "DDIMER" in the last 168 hours.   Radiology  MR BRAIN WO CONTRAST  Result Date: 04/02/2023 CLINICAL DATA:  62 year old male code stroke presentation yesterday. Left upper extremity tingling. Status post T NK. EXAM: MRI HEAD WITHOUT CONTRAST TECHNIQUE: Multiplanar,  multiecho pulse sequences of the brain and surrounding structures were obtained without intravenous contrast. COMPARISON:  Head CT and CTA head and neck yesterday. FINDINGS: Brain: Right superior frontal gyrus, motor or pre motor cortex and subcortical white matter restricted diffusion on series 2, image 40, series 3, image 19. This is about 15 mm. T2 and FLAIR hyperintensity associated (series 6, image 28) with petechial hemorrhage on series 7, image 75. No mass effect. No other diffusion restriction. Small area of chronic cortical encephalomalacia in the right occipital pole on series 6, image 13. Possibly mild hemosiderin there also. No midline shift, mass effect, evidence of mass lesion, ventriculomegaly, extra-axial collection or acute intracranial hemorrhage. Cervicomedullary junction and pituitary are within normal limits. Outside of the above findings there is scattered mild white matter T2 and FLAIR hyperintensity which is more often in the right hemisphere. The deep gray nuclei, brainstem, and cerebellum appear normal. No other chronic cerebral blood products. Vascular: Major intracranial vascular flow voids are preserved. Skull and upper cervical spine: Negative for age visible cervical spine. Visualized bone marrow signal is within normal limits. Sinuses/Orbits: Negative. Other: Negative visible internal auditory structures. Trace left mastoid fluid. Negative visible nasopharynx, scalp and face. IMPRESSION: 1. Positive for a small 1.5 cm Acute cortical and subcortical white matter infarct at the right superior frontal gyrus - motor or pre motor area. Petechial hemorrhage (Heidelberg classification 1a: HI1), but no malignant hemorrhagic transformation or mass effect. But no mass effect. 2. No other acute intracranial abnormality. Small chronic right occipital cortical infarct also with questionable hemosiderin. Otherwise mild for age white matter signal changes. Electronically Signed   By: Odessa Fleming M.D.    On: 04/02/2023 09:43   ECHOCARDIOGRAM COMPLETE  Result Date: 04/01/2023    ECHOCARDIOGRAM REPORT   Patient Name:   Aleksander Bures Date of Exam: 04/01/2023 Medical Rec #:  [id number]       Height:       70.0 in Accession #:    [id number]      Weight:       218.3 lb Date of Birth:  06-18-1961       BSA:          2.166 m Patient Age:    62 years        BP:           120/70 mmHg Patient Gender: M

## 2023-04-03 NOTE — Progress Notes (Signed)
PHARMACY - ANTICOAGULATION CONSULT NOTE  Pharmacy Consult for IV Heparin >> Eliquis Indication: atrial fibrillation and stroke  No Known Allergies  Patient Measurements: Height: 5\' 10"  (177.8 cm) Weight: 99 kg (218 lb 4.1 oz) IBW/kg (Calculated) : 73 Heparin Dosing Weight: 93.6 kg  Vital Signs: Temp: 98.6 F (37 C) (10/09 0738) Temp Source: Oral (10/09 0738) BP: 135/83 (10/09 0400) Pulse Rate: 81 (10/09 0500)  Labs: Recent Labs    04/01/23 0639 04/02/23 2046 04/03/23 0222  HGB 15.3  13.3  --  13.4  HCT 45.0  39.8  --  40.8  PLT 281  --  264  APTT 26  --   --   LABPROT 12.6  --   --   INR 0.9  --   --   HEPARINUNFRC  --  0.44 0.76*  CREATININE 0.84  0.80  --   --     Estimated Creatinine Clearance: 112.9 mL/min (by C-G formula based on SCr of 0.8 mg/dL).   Medical History: Past Medical History:  Diagnosis Date   Alcohol use    Gastric mass    /notes 02/04/2017   Heart palpitations 06/2016   Normocytic anemia 01/2017   Obesity 06/2016   Patient is Jehovah's Witness    Pleurisy ~ 2008   Refusal of blood transfusions as patient is Jehovah's Witness    Rosacea     Medications:  Scheduled:   influenza vac split trivalent PF  0.5 mL Intramuscular Tomorrow-1000   insulin aspart  0-15 Units Subcutaneous TID WC   metoprolol succinate  50 mg Oral Daily   pantoprazole (PROTONIX) IV  40 mg Intravenous QHS   pneumococcal 20-valent conjugate vaccine  0.5 mL Intramuscular Tomorrow-1000   rosuvastatin  20 mg Oral Daily   Infusions:   clevidipine Stopped (04/01/23 1929)   heparin Stopped (04/03/23 0759)    Assessment: 62 year old male with acute ischemic stroke s/p TNK on 10/7 at 0655 AM and new onset atrial fibrillation. Pharmacy consulted to dose IV Heparin therapy before starting oral anticoagulation therapy to determine toleration after discussion with Stroke team.   MRI 10/8 - no hemorrhagic transformation but petechial hemorrhage noted.   CBC is within  normal limits. Patient on anti-platelet therapy with low dose aspirin.  CBC stable. Noted hematoma above IV site status post alteplase for catheter clearance. No overt bleeding.    Goal of Therapy:  Heparin level 0.3-0.7 units/ml Monitor platelets by anticoagulation protocol: Yes   Plan:  Start Eliquis 5mg  po BID. Stop IV Heparin at the same time as give first dose of Eliquis.  30 day free copay card for Eliquis provided to patient.  Will need patient assistance program.    Link Snuffer, PharmD, BCPS, BCCCP Please refer to [facility] for [facility] numbers 04/03/2023,11:27 AM

## 2023-04-03 NOTE — Progress Notes (Signed)
PHARMACY - ANTICOAGULATION CONSULT NOTE  Pharmacy Consult for IV Heparin Indication: atrial fibrillation and stroke  No Known Allergies  Patient Measurements: Height: 5\' 10"  (177.8 cm) Weight: 99 kg (218 lb 4.1 oz) IBW/kg (Calculated) : 73 Heparin Dosing Weight: 93.6 kg  Vital Signs: Temp: 98.4 F (36.9 C) (10/08 2318) Temp Source: Oral (10/08 2318) BP: 118/74 (10/09 0000) Pulse Rate: 72 (10/09 0100)  Labs: Recent Labs    04/01/23 0639 04/02/23 2046 04/03/23 0222  HGB 15.3  13.3  --  13.4  HCT 45.0  39.8  --  40.8  PLT 281  --  264  APTT 26  --   --   LABPROT 12.6  --   --   INR 0.9  --   --   HEPARINUNFRC  --  0.44 0.76*  CREATININE 0.84  0.80  --   --     Estimated Creatinine Clearance: 112.9 mL/min (by C-G formula based on SCr of 0.8 mg/dL).   Medical History: Past Medical History:  Diagnosis Date   Alcohol use    Gastric mass    /notes 02/04/2017   Heart palpitations 06/2016   Normocytic anemia 01/2017   Obesity 06/2016   Patient is Jehovah's Witness    Pleurisy ~ 2008   Refusal of blood transfusions as patient is Jehovah's Witness    Rosacea     Medications:  Scheduled:   influenza vac split trivalent PF  0.5 mL Intramuscular Tomorrow-1000   insulin aspart  0-15 Units Subcutaneous TID WC   metoprolol succinate  50 mg Oral Daily   pantoprazole (PROTONIX) IV  40 mg Intravenous QHS   pneumococcal 20-valent conjugate vaccine  0.5 mL Intramuscular Tomorrow-1000   rosuvastatin  20 mg Oral Daily   Infusions:   clevidipine Stopped (04/01/23 1929)   heparin 1,100 Units/hr (04/03/23 0100)    Assessment: 62 year old male with acute ischemic stroke s/p TNK on 10/7 at 0655 AM and new onset atrial fibrillation. Pharmacy consulted to dose IV Heparin therapy before starting oral anticoagulation therapy to determine toleration after discussion with Stroke team.   MRI 10/8 - no hemorrhagic transformation but petechial hemorrhage noted.   CBC is within  normal limits. Patient on anti-platelet therapy with low dose aspirin.  Per discussion with team, if tolerates IV Heparin + ASA without concern, then will plan to transition to Pradaxa/other DOAC alone on 10/9 per Stroke best practice guidance.   10/9 AM update:  Heparin level supra-therapeutic   Goal of Therapy:  Heparin level 0.3-0.7 units/ml Monitor platelets by anticoagulation protocol: Yes   Plan:  Dec heparin to 950 units/hr Heparin level in 8 hours  Abran Duke, PharmD, BCPS Clinical Pharmacist Phone: [phone]

## 2023-04-03 NOTE — Discharge Instructions (Signed)
Information on my medicine - ELIQUIS (apixaban)  This medication education was reviewed with me or my healthcare representative as part of my discharge preparation.  The pharmacist that spoke with me during my hospital stay was:  Link Snuffer, PharmD  Why was Eliquis prescribed for you? Eliquis was prescribed for you to reduce the risk of a blood clot forming that can cause a stroke if you have a medical condition called atrial fibrillation (a type of irregular heartbeat).  What do You need to know about Eliquis ? Take your Eliquis TWICE DAILY - one tablet in the morning and one tablet in the evening with or without food. If you have difficulty swallowing the tablet whole please discuss with your pharmacist how to take the medication safely.  Take Eliquis exactly as prescribed by your doctor and DO NOT stop taking Eliquis without talking to the doctor who prescribed the medication.  Stopping may increase your risk of developing a stroke.  Refill your prescription before you run out.  After discharge, you should have regular check-up appointments with your healthcare provider that is prescribing your Eliquis.  In the future your dose may need to be changed if your kidney function or weight changes by a significant amount or as you get older.  What do you do if you miss a dose? If you miss a dose, take it as soon as you remember on the same day and resume taking twice daily.  Do not take more than one dose of ELIQUIS at the same time to make up a missed dose.  Important Safety Information A possible side effect of Eliquis is bleeding. You should call your healthcare provider right away if you experience any of the following: Bleeding from an injury or your nose that does not stop. Unusual colored urine (red or dark brown) or unusual colored stools (red or black). Unusual bruising for unknown reasons. A serious fall or if you hit your head (even if there is no bleeding).  Some  medicines may interact with Eliquis and might increase your risk of bleeding or clotting while on Eliquis. To help avoid this, consult your healthcare provider or pharmacist prior to using any new prescription or non-prescription medications, including herbals, vitamins, non-steroidal anti-inflammatory drugs (NSAIDs) and supplements.  This website has more information on Eliquis (apixaban): http://www.eliquis.com/eliquis/home

## 2023-04-03 NOTE — Discharge Summary (Addendum)
Stroke Discharge Summary  Patient ID: Samuel Clarke   MRN: [id number]      DOB: 1960/12/12  Date of Admission: 04/01/2023 Date of Discharge: 04/03/2023  Attending Physician:  Stroke, Md, MD, Stroke MD Consultant(s):    cardiology  Patient's PCP:  Caroline More, MD  Primary discharge diagnosis:  Acute Ischemic Infarct:  right cortical and subcortical white matter s/p TNK Etiology: Cardioembolic in the setting of new onset A-fib  DISCHARGE DIAGNOSIS:  Principal Problem:   Acute ischemic stroke [facility]) Active Problems:   Atrial fibrillation, new onset (HCC)   Essential hypertension   Hyperlipidemia   Diabetes (HCC)   Obesity   Allergies as of 04/03/2023   No Known Allergies      Medication List     STOP taking these medications    colchicine 0.6 MG tablet       TAKE these medications    apixaban 5 MG Tabs tablet Commonly known as: ELIQUIS Take 1 tablet (5 mg total) by mouth 2 (two) times daily.   metoprolol succinate 50 MG 24 hr tablet Commonly known as: TOPROL-XL Take 1 tablet (50 mg total) by mouth daily. Take with or immediately following a meal.   rosuvastatin 20 MG tablet Commonly known as: CRESTOR Take 1 tablet (20 mg total) by mouth daily.        LABORATORY STUDIES CBC    Component Value Date/Time   WBC 10.7 (H) 04/03/2023 0222   RBC 4.67 04/03/2023 0222   HGB 13.4 04/03/2023 0222   HGB 13.1 10/03/2022 1449   HCT 40.8 04/03/2023 0222   HCT 40.8 10/03/2022 1449   PLT 264 04/03/2023 0222   PLT 267 10/03/2022 1449   MCV 87.4 04/03/2023 0222   MCV 89 10/03/2022 1449   MCH 28.7 04/03/2023 0222   MCHC 32.8 04/03/2023 0222   RDW 15.4 04/03/2023 0222   RDW 14.6 10/03/2022 1449   LYMPHSABS 3.6 04/01/2023 0639   LYMPHSABS 3.2 (H) 10/03/2022 1449   MONOABS 0.9 04/01/2023 0639   EOSABS 0.3 04/01/2023 0639   EOSABS 0.3 10/03/2022 1449   BASOSABS 0.1 04/01/2023 0639   BASOSABS 0.0 10/03/2022 1449   CMP    Component Value Date/Time    NA 136 04/01/2023 0639   NA 135 04/01/2023 0639   NA 140 10/03/2022 1449   K 4.2 04/01/2023 0639   K 4.2 04/01/2023 0639   CL 101 04/01/2023 0639   CL 97 (L) 04/01/2023 0639   CO2 22 04/01/2023 0639   GLUCOSE 146 (H) 04/01/2023 0639   GLUCOSE 148 (H) 04/01/2023 0639   BUN 9 04/01/2023 0639   BUN 9 04/01/2023 0639   BUN 12 10/03/2022 1449   CREATININE 0.80 04/01/2023 0639   CREATININE 0.84 04/01/2023 0639   CALCIUM 8.7 (L) 04/01/2023 0639   PROT 7.1 04/01/2023 0639   ALBUMIN 3.7 04/01/2023 0639   AST 37 04/01/2023 0639   ALT 36 04/01/2023 0639   ALKPHOS 62 04/01/2023 0639   BILITOT 0.4 04/01/2023 0639   GFRNONAA >60 04/01/2023 0639   GFRAA >60 02/05/2017 0336   COAGS Lab Results  Component Value Date   INR 0.9 04/01/2023   INR 1.06 02/04/2017   Lipid Panel    Component Value Date/Time   CHOL 236 (H) 04/02/2023 0233   TRIG 98 04/02/2023 0233   TRIG 101 04/02/2023 0233   HDL 64 04/02/2023 0233   CHOLHDL 3.7 04/02/2023 0233   VLDL 20 04/02/2023 0233   LDLCALC  152 (H) 04/02/2023 0233   HgbA1C  Lab Results  Component Value Date   HGBA1C 7.1 (H) 04/01/2023   Urinalysis    Component Value Date/Time   COLORURINE YELLOW 04/01/2023 0636   APPEARANCEUR CLEAR 04/01/2023 0636   LABSPEC 1.012 04/01/2023 0636   PHURINE 6.0 04/01/2023 0636   GLUCOSEU NEGATIVE 04/01/2023 0636   HGBUR NEGATIVE 04/01/2023 0636   BILIRUBINUR NEGATIVE 04/01/2023 0636   KETONESUR NEGATIVE 04/01/2023 0636   PROTEINUR NEGATIVE 04/01/2023 0636   NITRITE NEGATIVE 04/01/2023 0636   LEUKOCYTESUR NEGATIVE 04/01/2023 0636   Urine Drug Screen     Component Value Date/Time   LABOPIA NONE DETECTED 04/01/2023 0636   COCAINSCRNUR NONE DETECTED 04/01/2023 0636   LABBENZ NONE DETECTED 04/01/2023 0636   AMPHETMU NONE DETECTED 04/01/2023 0636   THCU NONE DETECTED 04/01/2023 0636   LABBARB NONE DETECTED 04/01/2023 0636    Alcohol Level    Component Value Date/Time   ETH <10 04/01/2023 1610      SIGNIFICANT DIAGNOSTIC STUDIES MR BRAIN WO CONTRAST  Result Date: 04/02/2023 CLINICAL DATA:  62 year old male code stroke presentation yesterday. Left upper extremity tingling. Status post T NK. EXAM: MRI HEAD WITHOUT CONTRAST TECHNIQUE: Multiplanar, multiecho pulse sequences of the brain and surrounding structures were obtained without intravenous contrast. COMPARISON:  Head CT and CTA head and neck yesterday. FINDINGS: Brain: Right superior frontal gyrus, motor or pre motor cortex and subcortical white matter restricted diffusion on series 2, image 40, series 3, image 19. This is about 15 mm. T2 and FLAIR hyperintensity associated (series 6, image 28) with petechial hemorrhage on series 7, image 75. No mass effect. No other diffusion restriction. Small area of chronic cortical encephalomalacia in the right occipital pole on series 6, image 13. Possibly mild hemosiderin there also. No midline shift, mass effect, evidence of mass lesion, ventriculomegaly, extra-axial collection or acute intracranial hemorrhage. Cervicomedullary junction and pituitary are within normal limits. Outside of the above findings there is scattered mild white matter T2 and FLAIR hyperintensity which is more often in the right hemisphere. The deep gray nuclei, brainstem, and cerebellum appear normal. No other chronic cerebral blood products. Vascular: Major intracranial vascular flow voids are preserved. Skull and upper cervical spine: Negative for age visible cervical spine. Visualized bone marrow signal is within normal limits. Sinuses/Orbits: Negative. Other: Negative visible internal auditory structures. Trace left mastoid fluid. Negative visible nasopharynx, scalp and face. IMPRESSION: 1. Positive for a small 1.5 cm Acute cortical and subcortical white matter infarct at the right superior frontal gyrus - motor or pre motor area. Petechial hemorrhage (Heidelberg classification 1a: HI1), but no malignant hemorrhagic  transformation or mass effect. But no mass effect. 2. No other acute intracranial abnormality. Small chronic right occipital cortical infarct also with questionable hemosiderin. Otherwise mild for age white matter signal changes. Electronically Signed   By: Odessa Fleming M.D.   On: 04/02/2023 09:43   ECHOCARDIOGRAM COMPLETE  Result Date: 04/01/2023    ECHOCARDIOGRAM REPORT   Patient Name:   Samuel Clarke Date of Exam: 04/01/2023 Medical Rec #:  [id number]       Height:       70.0 in Accession #:    [id number]      Weight:       218.3 lb Date of Birth:  December 31, 1960       BSA:          2.166 m Patient Age:    62 years        BP:  152 (H) 04/02/2023 0233   HgbA1C  Lab Results  Component Value Date   HGBA1C 7.1 (H) 04/01/2023   Urinalysis    Component Value Date/Time   COLORURINE YELLOW 04/01/2023 0636   APPEARANCEUR CLEAR 04/01/2023 0636   LABSPEC 1.012 04/01/2023 0636   PHURINE 6.0 04/01/2023 0636   GLUCOSEU NEGATIVE 04/01/2023 0636   HGBUR NEGATIVE 04/01/2023 0636   BILIRUBINUR NEGATIVE 04/01/2023 0636   KETONESUR NEGATIVE 04/01/2023 0636   PROTEINUR NEGATIVE 04/01/2023 0636   NITRITE NEGATIVE 04/01/2023 0636   LEUKOCYTESUR NEGATIVE 04/01/2023 0636   Urine Drug Screen     Component Value Date/Time   LABOPIA NONE DETECTED 04/01/2023 0636   COCAINSCRNUR NONE DETECTED 04/01/2023 0636   LABBENZ NONE DETECTED 04/01/2023 0636   AMPHETMU NONE DETECTED 04/01/2023 0636   THCU NONE DETECTED 04/01/2023 0636   LABBARB NONE DETECTED 04/01/2023 0636    Alcohol Level    Component Value Date/Time   ETH <10 04/01/2023 1610      SIGNIFICANT DIAGNOSTIC STUDIES MR BRAIN WO CONTRAST  Result Date: 04/02/2023 CLINICAL DATA:  62 year old male code stroke presentation yesterday. Left upper extremity tingling. Status post T NK. EXAM: MRI HEAD WITHOUT CONTRAST TECHNIQUE: Multiplanar, multiecho pulse sequences of the brain and surrounding structures were obtained without intravenous contrast. COMPARISON:  Head CT and CTA head and neck yesterday. FINDINGS: Brain: Right superior frontal gyrus, motor or pre motor cortex and subcortical white matter restricted diffusion on series 2, image 40, series 3, image 19. This is about 15 mm. T2 and FLAIR hyperintensity associated (series 6, image 28) with petechial hemorrhage on series 7, image 75. No mass effect. No other diffusion restriction. Small area of chronic cortical encephalomalacia in the right occipital pole on series 6, image 13. Possibly mild hemosiderin there also. No midline shift, mass effect, evidence of mass lesion, ventriculomegaly, extra-axial collection or acute intracranial hemorrhage. Cervicomedullary junction and pituitary are within normal limits. Outside of the above findings there is scattered mild white matter T2 and FLAIR hyperintensity which is more often in the right hemisphere. The deep gray nuclei, brainstem, and cerebellum appear normal. No other chronic cerebral blood products. Vascular: Major intracranial vascular flow voids are preserved. Skull and upper cervical spine: Negative for age visible cervical spine. Visualized bone marrow signal is within normal limits. Sinuses/Orbits: Negative. Other: Negative visible internal auditory structures. Trace left mastoid fluid. Negative visible nasopharynx, scalp and face. IMPRESSION: 1. Positive for a small 1.5 cm Acute cortical and subcortical white matter infarct at the right superior frontal gyrus - motor or pre motor area. Petechial hemorrhage (Heidelberg classification 1a: HI1), but no malignant hemorrhagic  transformation or mass effect. But no mass effect. 2. No other acute intracranial abnormality. Small chronic right occipital cortical infarct also with questionable hemosiderin. Otherwise mild for age white matter signal changes. Electronically Signed   By: Odessa Fleming M.D.   On: 04/02/2023 09:43   ECHOCARDIOGRAM COMPLETE  Result Date: 04/01/2023    ECHOCARDIOGRAM REPORT   Patient Name:   Samuel Clarke Date of Exam: 04/01/2023 Medical Rec #:  [id number]       Height:       70.0 in Accession #:    [id number]      Weight:       218.3 lb Date of Birth:  December 31, 1960       BSA:          2.166 m Patient Age:    62 years        BP:  152 (H) 04/02/2023 0233   HgbA1C  Lab Results  Component Value Date   HGBA1C 7.1 (H) 04/01/2023   Urinalysis    Component Value Date/Time   COLORURINE YELLOW 04/01/2023 0636   APPEARANCEUR CLEAR 04/01/2023 0636   LABSPEC 1.012 04/01/2023 0636   PHURINE 6.0 04/01/2023 0636   GLUCOSEU NEGATIVE 04/01/2023 0636   HGBUR NEGATIVE 04/01/2023 0636   BILIRUBINUR NEGATIVE 04/01/2023 0636   KETONESUR NEGATIVE 04/01/2023 0636   PROTEINUR NEGATIVE 04/01/2023 0636   NITRITE NEGATIVE 04/01/2023 0636   LEUKOCYTESUR NEGATIVE 04/01/2023 0636   Urine Drug Screen     Component Value Date/Time   LABOPIA NONE DETECTED 04/01/2023 0636   COCAINSCRNUR NONE DETECTED 04/01/2023 0636   LABBENZ NONE DETECTED 04/01/2023 0636   AMPHETMU NONE DETECTED 04/01/2023 0636   THCU NONE DETECTED 04/01/2023 0636   LABBARB NONE DETECTED 04/01/2023 0636    Alcohol Level    Component Value Date/Time   ETH <10 04/01/2023 1610      SIGNIFICANT DIAGNOSTIC STUDIES MR BRAIN WO CONTRAST  Result Date: 04/02/2023 CLINICAL DATA:  62 year old male code stroke presentation yesterday. Left upper extremity tingling. Status post T NK. EXAM: MRI HEAD WITHOUT CONTRAST TECHNIQUE: Multiplanar, multiecho pulse sequences of the brain and surrounding structures were obtained without intravenous contrast. COMPARISON:  Head CT and CTA head and neck yesterday. FINDINGS: Brain: Right superior frontal gyrus, motor or pre motor cortex and subcortical white matter restricted diffusion on series 2, image 40, series 3, image 19. This is about 15 mm. T2 and FLAIR hyperintensity associated (series 6, image 28) with petechial hemorrhage on series 7, image 75. No mass effect. No other diffusion restriction. Small area of chronic cortical encephalomalacia in the right occipital pole on series 6, image 13. Possibly mild hemosiderin there also. No midline shift, mass effect, evidence of mass lesion, ventriculomegaly, extra-axial collection or acute intracranial hemorrhage. Cervicomedullary junction and pituitary are within normal limits. Outside of the above findings there is scattered mild white matter T2 and FLAIR hyperintensity which is more often in the right hemisphere. The deep gray nuclei, brainstem, and cerebellum appear normal. No other chronic cerebral blood products. Vascular: Major intracranial vascular flow voids are preserved. Skull and upper cervical spine: Negative for age visible cervical spine. Visualized bone marrow signal is within normal limits. Sinuses/Orbits: Negative. Other: Negative visible internal auditory structures. Trace left mastoid fluid. Negative visible nasopharynx, scalp and face. IMPRESSION: 1. Positive for a small 1.5 cm Acute cortical and subcortical white matter infarct at the right superior frontal gyrus - motor or pre motor area. Petechial hemorrhage (Heidelberg classification 1a: HI1), but no malignant hemorrhagic  transformation or mass effect. But no mass effect. 2. No other acute intracranial abnormality. Small chronic right occipital cortical infarct also with questionable hemosiderin. Otherwise mild for age white matter signal changes. Electronically Signed   By: Odessa Fleming M.D.   On: 04/02/2023 09:43   ECHOCARDIOGRAM COMPLETE  Result Date: 04/01/2023    ECHOCARDIOGRAM REPORT   Patient Name:   Samuel Clarke Date of Exam: 04/01/2023 Medical Rec #:  [id number]       Height:       70.0 in Accession #:    [id number]      Weight:       218.3 lb Date of Birth:  December 31, 1960       BSA:          2.166 m Patient Age:    62 years        BP:  152 (H) 04/02/2023 0233   HgbA1C  Lab Results  Component Value Date   HGBA1C 7.1 (H) 04/01/2023   Urinalysis    Component Value Date/Time   COLORURINE YELLOW 04/01/2023 0636   APPEARANCEUR CLEAR 04/01/2023 0636   LABSPEC 1.012 04/01/2023 0636   PHURINE 6.0 04/01/2023 0636   GLUCOSEU NEGATIVE 04/01/2023 0636   HGBUR NEGATIVE 04/01/2023 0636   BILIRUBINUR NEGATIVE 04/01/2023 0636   KETONESUR NEGATIVE 04/01/2023 0636   PROTEINUR NEGATIVE 04/01/2023 0636   NITRITE NEGATIVE 04/01/2023 0636   LEUKOCYTESUR NEGATIVE 04/01/2023 0636   Urine Drug Screen     Component Value Date/Time   LABOPIA NONE DETECTED 04/01/2023 0636   COCAINSCRNUR NONE DETECTED 04/01/2023 0636   LABBENZ NONE DETECTED 04/01/2023 0636   AMPHETMU NONE DETECTED 04/01/2023 0636   THCU NONE DETECTED 04/01/2023 0636   LABBARB NONE DETECTED 04/01/2023 0636    Alcohol Level    Component Value Date/Time   ETH <10 04/01/2023 1610      SIGNIFICANT DIAGNOSTIC STUDIES MR BRAIN WO CONTRAST  Result Date: 04/02/2023 CLINICAL DATA:  62 year old male code stroke presentation yesterday. Left upper extremity tingling. Status post T NK. EXAM: MRI HEAD WITHOUT CONTRAST TECHNIQUE: Multiplanar, multiecho pulse sequences of the brain and surrounding structures were obtained without intravenous contrast. COMPARISON:  Head CT and CTA head and neck yesterday. FINDINGS: Brain: Right superior frontal gyrus, motor or pre motor cortex and subcortical white matter restricted diffusion on series 2, image 40, series 3, image 19. This is about 15 mm. T2 and FLAIR hyperintensity associated (series 6, image 28) with petechial hemorrhage on series 7, image 75. No mass effect. No other diffusion restriction. Small area of chronic cortical encephalomalacia in the right occipital pole on series 6, image 13. Possibly mild hemosiderin there also. No midline shift, mass effect, evidence of mass lesion, ventriculomegaly, extra-axial collection or acute intracranial hemorrhage. Cervicomedullary junction and pituitary are within normal limits. Outside of the above findings there is scattered mild white matter T2 and FLAIR hyperintensity which is more often in the right hemisphere. The deep gray nuclei, brainstem, and cerebellum appear normal. No other chronic cerebral blood products. Vascular: Major intracranial vascular flow voids are preserved. Skull and upper cervical spine: Negative for age visible cervical spine. Visualized bone marrow signal is within normal limits. Sinuses/Orbits: Negative. Other: Negative visible internal auditory structures. Trace left mastoid fluid. Negative visible nasopharynx, scalp and face. IMPRESSION: 1. Positive for a small 1.5 cm Acute cortical and subcortical white matter infarct at the right superior frontal gyrus - motor or pre motor area. Petechial hemorrhage (Heidelberg classification 1a: HI1), but no malignant hemorrhagic  transformation or mass effect. But no mass effect. 2. No other acute intracranial abnormality. Small chronic right occipital cortical infarct also with questionable hemosiderin. Otherwise mild for age white matter signal changes. Electronically Signed   By: Odessa Fleming M.D.   On: 04/02/2023 09:43   ECHOCARDIOGRAM COMPLETE  Result Date: 04/01/2023    ECHOCARDIOGRAM REPORT   Patient Name:   Samuel Clarke Date of Exam: 04/01/2023 Medical Rec #:  [id number]       Height:       70.0 in Accession #:    [id number]      Weight:       218.3 lb Date of Birth:  December 31, 1960       BSA:          2.166 m Patient Age:    62 years        BP:  Stroke Discharge Summary  Patient ID: Samuel Clarke   MRN: [id number]      DOB: 1960/12/12  Date of Admission: 04/01/2023 Date of Discharge: 04/03/2023  Attending Physician:  Stroke, Md, MD, Stroke MD Consultant(s):    cardiology  Patient's PCP:  Caroline More, MD  Primary discharge diagnosis:  Acute Ischemic Infarct:  right cortical and subcortical white matter s/p TNK Etiology: Cardioembolic in the setting of new onset A-fib  DISCHARGE DIAGNOSIS:  Principal Problem:   Acute ischemic stroke [facility]) Active Problems:   Atrial fibrillation, new onset (HCC)   Essential hypertension   Hyperlipidemia   Diabetes (HCC)   Obesity   Allergies as of 04/03/2023   No Known Allergies      Medication List     STOP taking these medications    colchicine 0.6 MG tablet       TAKE these medications    apixaban 5 MG Tabs tablet Commonly known as: ELIQUIS Take 1 tablet (5 mg total) by mouth 2 (two) times daily.   metoprolol succinate 50 MG 24 hr tablet Commonly known as: TOPROL-XL Take 1 tablet (50 mg total) by mouth daily. Take with or immediately following a meal.   rosuvastatin 20 MG tablet Commonly known as: CRESTOR Take 1 tablet (20 mg total) by mouth daily.        LABORATORY STUDIES CBC    Component Value Date/Time   WBC 10.7 (H) 04/03/2023 0222   RBC 4.67 04/03/2023 0222   HGB 13.4 04/03/2023 0222   HGB 13.1 10/03/2022 1449   HCT 40.8 04/03/2023 0222   HCT 40.8 10/03/2022 1449   PLT 264 04/03/2023 0222   PLT 267 10/03/2022 1449   MCV 87.4 04/03/2023 0222   MCV 89 10/03/2022 1449   MCH 28.7 04/03/2023 0222   MCHC 32.8 04/03/2023 0222   RDW 15.4 04/03/2023 0222   RDW 14.6 10/03/2022 1449   LYMPHSABS 3.6 04/01/2023 0639   LYMPHSABS 3.2 (H) 10/03/2022 1449   MONOABS 0.9 04/01/2023 0639   EOSABS 0.3 04/01/2023 0639   EOSABS 0.3 10/03/2022 1449   BASOSABS 0.1 04/01/2023 0639   BASOSABS 0.0 10/03/2022 1449   CMP    Component Value Date/Time    NA 136 04/01/2023 0639   NA 135 04/01/2023 0639   NA 140 10/03/2022 1449   K 4.2 04/01/2023 0639   K 4.2 04/01/2023 0639   CL 101 04/01/2023 0639   CL 97 (L) 04/01/2023 0639   CO2 22 04/01/2023 0639   GLUCOSE 146 (H) 04/01/2023 0639   GLUCOSE 148 (H) 04/01/2023 0639   BUN 9 04/01/2023 0639   BUN 9 04/01/2023 0639   BUN 12 10/03/2022 1449   CREATININE 0.80 04/01/2023 0639   CREATININE 0.84 04/01/2023 0639   CALCIUM 8.7 (L) 04/01/2023 0639   PROT 7.1 04/01/2023 0639   ALBUMIN 3.7 04/01/2023 0639   AST 37 04/01/2023 0639   ALT 36 04/01/2023 0639   ALKPHOS 62 04/01/2023 0639   BILITOT 0.4 04/01/2023 0639   GFRNONAA >60 04/01/2023 0639   GFRAA >60 02/05/2017 0336   COAGS Lab Results  Component Value Date   INR 0.9 04/01/2023   INR 1.06 02/04/2017   Lipid Panel    Component Value Date/Time   CHOL 236 (H) 04/02/2023 0233   TRIG 98 04/02/2023 0233   TRIG 101 04/02/2023 0233   HDL 64 04/02/2023 0233   CHOLHDL 3.7 04/02/2023 0233   VLDL 20 04/02/2023 0233   LDLCALC

## 2023-04-08 ENCOUNTER — Other Ambulatory Visit (HOSPITAL_COMMUNITY): Payer: Self-pay

## 2023-04-16 ENCOUNTER — Other Ambulatory Visit: Payer: Self-pay

## 2023-04-16 ENCOUNTER — Ambulatory Visit: Payer: 59 | Attending: Nurse Practitioner | Admitting: Occupational Therapy

## 2023-04-16 DIAGNOSIS — M6281 Muscle weakness (generalized): Secondary | ICD-10-CM

## 2023-04-16 DIAGNOSIS — I69354 Hemiplegia and hemiparesis following cerebral infarction affecting left non-dominant side: Secondary | ICD-10-CM

## 2023-04-16 DIAGNOSIS — R278 Other lack of coordination: Secondary | ICD-10-CM | POA: Diagnosis not present

## 2023-04-16 DIAGNOSIS — I639 Cerebral infarction, unspecified: Secondary | ICD-10-CM | POA: Diagnosis not present

## 2023-04-16 NOTE — Therapy (Signed)
OUTPATIENT OCCUPATIONAL THERAPY NEURO EVALUATION  Patient Name: Samuel Clarke MRN: [id number] DOB:1960/08/20, 62 y.o., male Today's Date: 04/16/2023  PCP: Caroline More, MD REFERRING PROVIDER: Mathews Argyle, NP  END OF SESSION:  OT End of Session - 04/16/23 0904     Visit Number 1    Number of Visits 7    Date for OT Re-Evaluation 05/17/23    Authorization Type [employer]    OT Start Time 0848    OT Stop Time 0933    OT Time Calculation (min) 45 min             Past Medical History:  Diagnosis Date   Alcohol use    Gastric mass    /notes 02/04/2017   Heart palpitations 06/2016   Normocytic anemia 01/2017   Obesity 06/2016   Patient is Jehovah's Witness    Pleurisy ~ 2008   Refusal of blood transfusions as patient is Jehovah's Witness    Rosacea    Past Surgical History:  Procedure Laterality Date   ABDOMINAL SURGERY     ESOPHAGOGASTRODUODENOSCOPY     ESOPHAGOGASTRODUODENOSCOPY N/A 02/04/2017   Procedure: ESOPHAGOGASTRODUODENOSCOPY (EGD);  Surgeon: Meryl Dare, MD;  Location: [facility] ENDOSCOPY;  Service: Endoscopy;  Laterality: N/A;   TONSILLECTOMY     At age 80 or 65   Patient Active Problem List   Diagnosis Date Noted   Atrial fibrillation, new onset (HCC) 04/03/2023   Essential hypertension 04/03/2023   Hyperlipidemia 04/03/2023   Diabetes (HCC) 04/03/2023   Obesity 04/03/2023   Acute ischemic stroke (HCC) 04/01/2023   Hyponatremia 02/04/2017   Normocytic anemia 02/04/2017   Leukocytosis 02/04/2017   Alcohol use 02/04/2017   Patient is Jehovah's Witness 02/04/2017   Gastric mass 02/04/2017   Chest pain 02/04/2017   Abdominal pain 02/04/2017   Abnormal CT scan, stomach     ONSET DATE: 04/01/23  REFERRING DIAG: I63.9 (ICD-10-CM) - Acute ischemic stroke  THERAPY DIAG:  Hemiplegia and hemiparesis following cerebral infarction affecting left non-dominant side (HCC)  Other lack of coordination  Muscle weakness (generalized)  Rationale for  Evaluation and Treatment: Rehabilitation  SUBJECTIVE:   SUBJECTIVE STATEMENT: Pt reports "touch of arthritis in L shoulder", reports having a fall ~20 years ago but never got any treatment for it.  Pt reports since the stroke, it feels like it has less "muscle" support.  Pt reports tingling sensation through L arm with decreased motor control initially upon onset of stroke.  Does note L arm still not quite where it was before. Pt accompanied by: self  PERTINENT HISTORY: . MRI 10/8:small 1.5 cm Acute cortical and subcortical white matter infarct at the right superior frontal gyrus. PMHx: GIST, ETOH abuse, obesity.  PRECAUTIONS: None  WEIGHT BEARING RESTRICTIONS: No  PAIN:  Are you having pain? No  FALLS: Has patient fallen in last 6 months? No  LIVING ENVIRONMENT: Lives with: lives with their spouse Lives in: House/apartment Stairs: Yes: Internal: 12 steps down basement steps; on left going up and External: 3 steps to back, front has 4 steps; none Has following equipment at home:  uses a hiking stick when walking to/from mail box on gravel  PLOF: Independent, Independent with basic ADLs, and Vocation/Vocational requirements: window cleaner  PATIENT GOALS: to regain full control of L arm  OBJECTIVE:  Note: Objective measures were completed at Evaluation unless otherwise noted.  HAND DOMINANCE: Right  ADLs: Overall ADLs: Independent with bathing/dressing and transfers Transfers/ambulation related to ADLs: Independent Eating: Mod I -  is attempting to eat occasionally with LUE to address motor control  IADLs: Light housekeeping: washing dishes Meal Prep: enjoys cooking. Is engaging in chopping foods without any issue. Community mobility: still driving Medication management: Independent with opening pill bottles, uses alarm for recall to take meds as prescribed Handwriting: 100% legible  MOBILITY STATUS: Independent  POSTURE COMMENTS:  No Significant postural  limitations  ACTIVITY TOLERANCE: Activity tolerance: diminished  FUNCTIONAL OUTCOME MEASURES: FOTO: 72, predicted outcome 82 in 13 visits  UPPER EXTREMITY ROM:    Active ROM Right eval Left eval  Shoulder flexion [facility]  Shoulder abduction [facility]  Shoulder adduction    Shoulder extension    Shoulder internal rotation [facility] WFL  Shoulder external rotation [facility] WFL  Elbow flexion WFL WFL  Elbow extension [facility] WFL  Wrist flexion    Wrist extension    Wrist ulnar deviation    Wrist radial deviation    Wrist pronation    Wrist supination    (Blank rows = not tested)  UPPER EXTREMITY MMT:     MMT Right eval Left eval  Shoulder flexion 5/5 4/5  Shoulder abduction    Shoulder adduction    Shoulder extension    Shoulder internal rotation    Shoulder external rotation    Middle trapezius    Lower trapezius    Elbow flexion 5/5 4/5  Elbow extension 5/5 4/5  Wrist flexion    Wrist extension    Wrist ulnar deviation    Wrist radial deviation    Wrist pronation    Wrist supination    (Blank rows = not tested)  HAND FUNCTION: Grip strength: Right: 78 lbs; Left: 76 lbs  COORDINATION: Finger Nose Finger test: mild dysmetria on L 9 Hole Peg test: Right: 31.06 sec; Left: 36.22 (mild overshooting) sec Box and Blocks:  Right 43 blocks, Left 40 (mild hesitancy with movement)blocks Dysdiadochokinesia with rapid alternating hand movements, mild impairment on L  SENSATION: WFL  COGNITION: Overall cognitive status: Within functional limits for tasks assessed  VISION: Subjective report: no changes, wears glasses all the time   TODAY'S TREATMENT:                                                                            DATE:  04/16/23 LUE strengthening: engaged in instruction of BUE and LUE strengthening with use of red theraband.  OT providing demonstration with pt demonstrating good understanding of each movement with shoulder horizontal abduction, external rotation and  scapular retraction, and L shoulder PNF flexion.  OT also educating on scapular retraction without resistance for improved upright positioning and shoulder stability for progression into reaching.   PATIENT EDUCATION: Education details: Educated on role and purpose of OT as well as potential interventions and goals for therapy based on initial evaluation findings. Person educated: Patient Education method: Explanation, Verbal cues, and Handouts Education comprehension: verbalized understanding and needs further education  HOME EXERCISE PROGRAM: Access Code: [email or web address] URL: [email or web address] Date: 04/16/2023 Prepared by: [facility] - Outpatient  Rehab - [facility]  Exercises - Seated Scapular Retraction  - 2 x daily - 3 sets - 10 reps - Standing Shoulder Horizontal Abduction with Resistance  - 2 x daily -  2 sets - 10 reps - 3 sec hold - Shoulder External Rotation and Scapular Retraction with Resistance  - 2 x daily - 2 sets - 10 reps - 3 sec hold - Standing Shoulder Single Arm PNF D2 Flexion with Resistance  - 2 x daily - 2 sets - 10 reps - 3 sec hold   GOALS: Goals reviewed with patient? Yes   LONG TERM GOALS: Target date: 05/17/23  Pt will be independent in strengthening and coordination HEP. Baseline:  Goal status: INITIAL  2.  Pt will demonstrate improved overhead strength and endurance as needed to put away dishes in an overhead cabinet Baseline:  Goal status: INITIAL  3.  Pt will demonstrate improved fine motor coordination for ADLs as evidenced by decreasing 9 hole peg test score for LUE by 3 secs Baseline: Right: 31.06 sec; Left: 36.22 Goal status: INITIAL  4.  Pt will demonstrate improved UE functional use for ADLs as evidenced by increasing box/ blocks score by 3 blocks with LUE. Baseline: R: 43 blocks, L: 40 blocks Goal status: INITIAL  5.  Pt will report and/or demonstrate improved functional use of LUE as evidenced by improved score on FOTO  to 78 or greater by discharge. Baseline: 72 Goal status: INITIAL    ASSESSMENT:  CLINICAL IMPRESSION: Patient is a 62 y.o. famle who was seen today for occupational therapy evaluation for decreased proximal coordination of LUE, with difficulty with reaching tasks and finger to nose.  Due to nature of pt's job, residential window washer, pt will benefit from skilled occupational therapy services.  Pt will benefit from skilled occupational therapy services to address strength and coordination, GM/FM control, cognition, safety awareness, introduction of compensatory strategies/AE prn, and implementation of an HEP to improve participation and safety during IADLs and work activities.    PERFORMANCE DEFICITS: in functional skills including IADLs, coordination, proprioception, strength, Fine motor control, Gross motor control, body mechanics, endurance, decreased knowledge of precautions, decreased knowledge of use of DME, and UE functional use and psychosocial skills including environmental adaptation.   IMPAIRMENTS: are limiting patient from IADLs and work.   CO-MORBIDITIES: may have co-morbidities  that affects occupational performance. Patient will benefit from skilled OT to address above impairments and improve overall function.  MODIFICATION OR ASSISTANCE TO COMPLETE EVALUATION: No modification of tasks or assist necessary to complete an evaluation.  OT OCCUPATIONAL PROFILE AND HISTORY: Problem focused assessment: Including review of records relating to presenting problem.  CLINICAL DECISION MAKING: LOW - limited treatment options, no task modification necessary  REHAB POTENTIAL: Good  EVALUATION COMPLEXITY: Low    PLAN:  OT FREQUENCY: 1-2x/week  OT DURATION: 4 weeks  PLANNED INTERVENTIONS: 97168 OT Re-evaluation, 97535 self care/ADL training, 25366 therapeutic exercise, 97530 therapeutic activity, 97112 neuromuscular re-education, 97035 ultrasound, 97010 moist heat, 97010  cryotherapy, 97032 electrical stimulation (manual), functional mobility training, psychosocial skills training, energy conservation, coping strategies training, patient/family education, and DME and/or AE instructions  RECOMMENDED OTHER SERVICES: NA  CONSULTED AND AGREED WITH PLAN OF CARE: Patient  PLAN FOR NEXT SESSION: review theraband exercises, initiate coordination HEP, engage in functional overhead reaching   [place], Tauno Falotico, OTR/L 04/16/2023, 4:17 PM   [facility] at [facility] [address] [place], [place] [address] Phone # [phone] Fax # [phone]

## 2023-04-23 ENCOUNTER — Ambulatory Visit (HOSPITAL_COMMUNITY)
Admit: 2023-04-23 | Discharge: 2023-04-23 | Disposition: A | Discharge status: Home / Self Care | Payer: 59 | Source: Ambulatory Visit | Attending: Physician Assistant | Admitting: Physician Assistant

## 2023-04-23 ENCOUNTER — Encounter (HOSPITAL_COMMUNITY): Payer: Self-pay | Admitting: Physician Assistant

## 2023-04-23 VITALS — BP 122/82 | HR 83 | Ht 70.0 in | Wt 209.4 lb

## 2023-04-23 DIAGNOSIS — Z7901 Long term (current) use of anticoagulants: Secondary | ICD-10-CM | POA: Diagnosis not present

## 2023-04-23 DIAGNOSIS — D6869 Other thrombophilia: Secondary | ICD-10-CM | POA: Diagnosis not present

## 2023-04-23 DIAGNOSIS — R9431 Abnormal electrocardiogram [ECG] [EKG]: Secondary | ICD-10-CM | POA: Diagnosis not present

## 2023-04-23 DIAGNOSIS — C49A0 Gastrointestinal stromal tumor, unspecified site: Secondary | ICD-10-CM | POA: Insufficient documentation

## 2023-04-23 DIAGNOSIS — Z8673 Personal history of transient ischemic attack (TIA), and cerebral infarction without residual deficits: Secondary | ICD-10-CM | POA: Diagnosis not present

## 2023-04-23 DIAGNOSIS — I10 Essential (primary) hypertension: Secondary | ICD-10-CM | POA: Diagnosis not present

## 2023-04-23 DIAGNOSIS — I4819 Other persistent atrial fibrillation: Secondary | ICD-10-CM | POA: Diagnosis not present

## 2023-04-23 DIAGNOSIS — E785 Hyperlipidemia, unspecified: Secondary | ICD-10-CM | POA: Diagnosis not present

## 2023-04-23 DIAGNOSIS — R0683 Snoring: Secondary | ICD-10-CM | POA: Diagnosis not present

## 2023-04-23 DIAGNOSIS — I4891 Unspecified atrial fibrillation: Secondary | ICD-10-CM | POA: Diagnosis not present

## 2023-04-23 MED ORDER — METOPROLOL SUCCINATE ER 50 MG PO TB24: 50.0000 mg | ORAL_TABLET | Freq: Every day | ORAL | 0 refills | Status: DC

## 2023-04-23 MED ORDER — APIXABAN 5 MG PO TABS: 5.0000 mg | ORAL_TABLET | Freq: Two times a day (BID) | ORAL | 0 refills | Status: DC

## 2023-04-23 MED ORDER — ROSUVASTATIN CALCIUM 20 MG PO TABS: 20.0000 mg | ORAL_TABLET | Freq: Every day | ORAL | 0 refills | Status: DC

## 2023-04-23 NOTE — Progress Notes (Signed)
Primary Care Physician: Caroline More, MD Primary Cardiologist: Reatha Harps, MD Electrophysiologist: None  Referring Physician: [name] is a 62 y.o. male with a history of GIST, CVA, alcohol abuse, HLD, DM, HTN, atrial fibrillation who presents for follow up in the [facility].  The patient presented to the ED early on the morning of 04/01/23 with acute weakness and tingling of his left arm. He described a progression of symptoms from tingling to significant numbness/weakness. CT head inconclusive but with exam positive for acute neuro deficit, patient received TNK. MRI showed small 1.5cm acute cortical and subcortical white matter infarct at superior frontal gyrus with petechial hemorrhage but no malignant hemorrhagic transformation. He was also noted to be in rapid afib. Patient was started on Eliquis for a CHADS2VASC score of 4. He admits to alcohol use and snoring.   On follow up today, patient remains in rate controlled afib. In hindsight, he has noticed more fatigue and SOB with exertion since May of this year. He does admit to significant snoring. He has not had any alcohol since his CVA.  Today, he denies symptoms of palpitations, chest pain, shortness of breath, orthopnea, PND, lower extremity edema, dizziness, presyncope, syncope, snoring, daytime somnolence, bleeding, or neurologic sequela. The patient is tolerating medications without difficulties and is otherwise without complaint today.    Atrial Fibrillation Risk Factors:  he does have symptoms or diagnosis of sleep apnea. he does not have a history of rheumatic fever. he does have a history of alcohol use. The patient does not have a history of early familial atrial fibrillation or other arrhythmias.  Atrial Fibrillation Management history:  Previous antiarrhythmic drugs: none Previous cardioversions: none Previous ablations: none Anticoagulation history:  Eliquis  ROS- All systems are reviewed and negative except as per the HPI above.  Past Medical History:  Diagnosis Date   Alcohol use    Gastric mass    /notes 02/04/2017   Heart palpitations 06/2016   Normocytic anemia 01/2017   Obesity 06/2016   Patient is Jehovah's Witness    Pleurisy ~ 2008   Refusal of blood transfusions as patient is Jehovah's Witness    Rosacea     Current Outpatient Medications  Medication Sig Dispense Refill   apixaban (ELIQUIS) 5 MG TABS tablet Take 1 tablet (5 mg total) by mouth 2 (two) times daily. 180 tablet 0   metoprolol succinate (TOPROL-XL) 50 MG 24 hr tablet Take 1 tablet (50 mg total) by mouth daily. Take with or immediately following a meal. 90 tablet 0   rosuvastatin (CRESTOR) 20 MG tablet Take 1 tablet (20 mg total) by mouth daily. 90 tablet 0   No current facility-administered medications for this encounter.    Physical Exam: BP 122/82   Pulse 83   Ht 5\' 10"  (1.778 m)   Wt 95 kg   BMI 30.05 kg/m   GEN: Well nourished, well developed in no acute distress NECK: No JVD; No carotid bruits CARDIAC: Irregularly irregular rate and rhythm, no murmurs, rubs, gallops RESPIRATORY:  Clear to auscultation without rales, wheezing or rhonchi  ABDOMEN: Soft, non-tender, non-distended EXTREMITIES:  No edema; No deformity   Wt Readings from Last 3 Encounters:  04/23/23 95 kg  04/01/23 99 kg  02/04/17 98 kg     EKG today demonstrates  Afib Vent. rate 83 BPM PR interval * ms QRS duration 84 ms QT/QTcB 384/451 ms  Echo 04/01/23 demonstrated   1.  Left ventricular ejection fraction, by estimation, is 55 to 60%. The  left ventricle has normal function. The left ventricle has no regional  wall motion abnormalities. There is mild concentric left ventricular  hypertrophy. Left ventricular diastolic function could not be evaluated.   2. Right ventricular systolic function is normal. The right ventricular  size is normal. There is normal pulmonary  artery systolic pressure.   3. Left atrial size was severely dilated.   4. The mitral valve is normal in structure. Trivial mitral valve  regurgitation. No evidence of mitral stenosis.   5. The aortic valve is normal in structure. Aortic valve regurgitation is  not visualized. No aortic stenosis is present.   6. The inferior vena cava is normal in size with greater than 50%  respiratory variability, suggesting right atrial pressure of 3 mmHg.    CHA2DS2-VASc Score = 4  The patient's score is based upon: CHF History: 0 HTN History: 1 Diabetes History: 1 Stroke History: 2 Vascular Disease History: 0 Age Score: 0 Gender Score: 0       ASSESSMENT AND PLAN: Persistent Atrial Fibrillation (ICD10:  I48.19) The patient's CHA2DS2-VASc score is 4, indicating a 4.8% annual risk of stroke.   General education about afib provided and questions answered. We also discussed his stroke risk and the risks and benefits of anticoagulation. Patient in rate controlled afib today. We did discuss DCCV after adequate anticoagulation today.  Continue Eliquis 5 mg BID Continue Toprol 50 mg daily  Secondary Hypercoagulable State (ICD10:  D68.69) The patient is at significant risk for stroke/thromboembolism based upon his CHA2DS2-VASc Score of 4.  Continue Apixaban (Eliquis).   Snoring/suspected OSA  The importance of adequate treatment of sleep apnea was discussed today in order to improve our ability to maintain sinus rhythm long term. Will refer for sleep study.   HTN Stable on current regimen   Follow up with Azalee Course as scheduled.        Jorja Loa PA-C [facility] [address] [place], [place] [address] [phone]

## 2023-04-26 ENCOUNTER — Ambulatory Visit: Payer: 59 | Attending: Nurse Practitioner | Admitting: Occupational Therapy

## 2023-04-26 DIAGNOSIS — R278 Other lack of coordination: Secondary | ICD-10-CM | POA: Insufficient documentation

## 2023-04-26 DIAGNOSIS — I69354 Hemiplegia and hemiparesis following cerebral infarction affecting left non-dominant side: Secondary | ICD-10-CM | POA: Insufficient documentation

## 2023-04-26 DIAGNOSIS — M6281 Muscle weakness (generalized): Secondary | ICD-10-CM | POA: Insufficient documentation

## 2023-04-26 NOTE — Therapy (Signed)
OUTPATIENT OCCUPATIONAL THERAPY NEURO  Treatment Note  Patient Name: Samuel Clarke MRN: [id number] DOB:10-Apr-1961, 62 y.o., male Today's Date: 04/26/2023  PCP: Caroline More, MD REFERRING PROVIDER: Mathews Argyle, NP  END OF SESSION:  OT End of Session - 04/26/23 1031     Visit Number 2    Number of Visits 7    Date for OT Re-Evaluation 05/17/23    Authorization Type [employer]    OT Start Time 0932    OT Stop Time 1017    OT Time Calculation (min) 45 min    Activity Tolerance Patient tolerated treatment well              Past Medical History:  Diagnosis Date   Alcohol use    Gastric mass    /notes 02/04/2017   Heart palpitations 06/2016   Normocytic anemia 01/2017   Obesity 06/2016   Patient is Jehov[employer]'s Witness    Pleurisy ~ 2008   Refusal of blood transfusions as patient is Jehovah's Witness    Rosacea    Past Surgical History:  Procedure Laterality Date   ABDOMINAL SURGERY     ESOPHAGOGASTRODUODENOSCOPY     ESOPHAGOGASTRODUODENOSCOPY N/A 02/04/2017   Procedure: ESOPHAGOGASTRODUODENOSCOPY (EGD);  Surgeon: Meryl Dare, MD;  Location: [facility] ENDOSCOPY;  Service: Endoscopy;  Laterality: N/A;   TONSILLECTOMY     At age 45 or 7   Patient Active Problem List   Diagnosis Date Noted   Hypercoagulable state due to persistent atrial fibrillation (HCC) 04/23/2023   Persistent atrial fibrillation (HCC) 04/03/2023   Essential hypertension 04/03/2023   Hyperlipidemia 04/03/2023   Diabetes (HCC) 04/03/2023   Obesity 04/03/2023   Acute ischemic stroke (HCC) 04/01/2023   Hyponatremia 02/04/2017   Normocytic anemia 02/04/2017   Leukocytosis 02/04/2017   Alcohol use 02/04/2017   Patient is Jehovah's Witness 02/04/2017   Gastric mass 02/04/2017   Chest pain 02/04/2017   Abdominal pain 02/04/2017   Abnormal CT scan, stomach     ONSET DATE: 04/01/23  REFERRING DIAG: I63.9 (ICD-10-CM) - Acute ischemic stroke  THERAPY DIAG:  Hemiplegia and hemiparesis  following cerebral infarction affecting left non-dominant side (HCC)  Other lack of coordination  Muscle weakness (generalized)  Rationale for Evaluation and Treatment: Rehabilitation  SUBJECTIVE:   SUBJECTIVE STATEMENT: Pt reports "the exercises have been helping a great deal" but that he is having difficulty with one in particular.  Pt reports "stiffness" in L upper arm with external rotation. Pt accompanied by: self  PERTINENT HISTORY: . MRI 10/8:small 1.5 cm Acute cortical and subcortical white matter infarct at the right superior frontal gyrus. PMHx: GIST, ETOH abuse, obesity.  PRECAUTIONS: None  WEIGHT BEARING RESTRICTIONS: No  PAIN:  Are you having pain? No  FALLS: Has patient fallen in last 6 months? No  LIVING ENVIRONMENT: Lives with: lives with their spouse Lives in: House/apartment Stairs: Yes: Internal: 12 steps down basement steps; on left going up and External: 3 steps to back, front has 4 steps; none Has following equipment at home:  uses a hiking stick when walking to/from mail box on gravel  PLOF: Independent, Independent with basic ADLs, and Vocation/Vocational requirements: window cleaner  PATIENT GOALS: to regain full control of L arm  OBJECTIVE:  Note: Objective measures were completed at Evaluation unless otherwise noted.  HAND DOMINANCE: Right  ADLs: Overall ADLs: Independent with bathing/dressing and transfers Transfers/ambulation related to ADLs: Independent Eating: Mod I - is attempting to eat occasionally with LUE to address motor control  IADLs: Light housekeeping: washing dishes Meal Prep: enjoys cooking. Is engaging in chopping foods without any issue. Community mobility: still driving Medication management: Independent with opening pill bottles, uses alarm for recall to take meds as prescribed Handwriting: 100% legible  MOBILITY STATUS: Independent  POSTURE COMMENTS:  No Significant postural limitations  ACTIVITY  TOLERANCE: Activity tolerance: diminished  FUNCTIONAL OUTCOME MEASURES: FOTO: 72, predicted outcome 82 in 13 visits  UPPER EXTREMITY ROM:    Active ROM Right eval Left eval  Shoulder flexion [facility]  Shoulder abduction [facility]  Shoulder adduction    Shoulder extension    Shoulder internal rotation [facility] WFL  Shoulder external rotation [facility] WFL  Elbow flexion WFL WFL  Elbow extension [facility] WFL  Wrist flexion    Wrist extension    Wrist ulnar deviation    Wrist radial deviation    Wrist pronation    Wrist supination    (Blank rows = not tested)  UPPER EXTREMITY MMT:     MMT Right eval Left eval  Shoulder flexion 5/5 4/5  Shoulder abduction    Shoulder adduction    Shoulder extension    Shoulder internal rotation    Shoulder external rotation    Middle trapezius    Lower trapezius    Elbow flexion 5/5 4/5  Elbow extension 5/5 4/5  Wrist flexion    Wrist extension    Wrist ulnar deviation    Wrist radial deviation    Wrist pronation    Wrist supination    (Blank rows = not tested)  HAND FUNCTION: Grip strength: Right: 78 lbs; Left: 76 lbs  COORDINATION: Finger Nose Finger test: mild dysmetria on L 9 Hole Peg test: Right: 31.06 sec; Left: 36.22 (mild overshooting) sec Box and Blocks:  Right 43 blocks, Left 40 (mild hesitancy with movement)blocks Dysdiadochokinesia with rapid alternating hand movements, mild impairment on L  SENSATION: WFL  COGNITION: Overall cognitive status: Within functional limits for tasks assessed  VISION: Subjective report: no changes, wears glasses all the time   TODAY'S TREATMENT:                                                                            DATE:  04/26/23 External rotation: Pt reports that he was initially having difficulty with shoulder external rotation but was able to decrease reps and remove resistance and has been able to increase tolerance to completing a set of 10 with red resistance just the other day! Pt  demonstrating improved quality of movements with external rotation against resistance this session. 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  Pt completing set of 5 on 10# resistance with min cues to focus on shoulder and UB movements for strengthening, not compensating with trunk rotation. Resistance bands: engaged in shoulder extension and horizontal abduction/adduction with BUE with green theraband and then blue theraband anchored over door to further simulate window washing in a fashion that he can complete at home.  Pt completing x5 with good technique and modifications to further simulate occupation.  OT further instructed pt in lat pull downs and mini  band reach overhead to address serratus strength and stability.  Pt completing each x5.   Dowel: OT instructed pt in shoulder flexion, abduction, and scaption with dowel to further address ROM without resistance to focus on quality of movement to carry over to work tasks.    04/16/23 LUE strengthening: engaged in instruction of BUE and LUE strengthening with use of red theraband.  OT providing demonstration with pt demonstrating good understanding of each movement with shoulder horizontal abduction, external rotation and scapular retraction, and L shoulder PNF flexion.  OT also educating on scapular retraction without resistance for improved upright positioning and shoulder stability for progression into reaching.   PATIENT EDUCATION: Education details: ongoing condition specific education Person educated: Patient Education method: Explanation, Verbal cues, and Handouts Education comprehension: verbalized understanding and needs further education  HOME EXERCISE PROGRAM: Access Code: [email or web address] URL: [email or web address] Date: 04/26/2023 Prepared by: [facility] Outpatient  Rehab -  [facility]  Exercises - Seated Scapular Retraction  - 3 x weekly - 2 sets - 10 reps - Standing Shoulder Horizontal Abduction with Resistance  - 3 x weekly - 2 sets - 10 reps - 3 sec hold - Shoulder External Rotation and Scapular Retraction with Resistance  - 3 x weekly - 2 sets - 10 reps - 3 sec hold - Standing Shoulder Single Arm PNF D2 Flexion with Resistance  - 3 x weekly - 2 sets - 10 reps - 3 sec hold - Standing Shoulder Abduction AAROM with Dowel  - 3 x weekly - 2 sets - 10 reps - 3-5 second hold - Shoulder Flexion Overhead with Dowel  - 3 x weekly - 2 sets - 10 reps - 3-5 second hold - Shoulder Scaption AAROM with Dowel  - 3 x weekly - 2 sets - 10 reps - 3-5 seconds hold - Staggered Lat Pull Down with Resistance - Elbows Bent  - 3 x weekly - 2 sets - 10 reps - Single Arm Shoulder Extension with Anchored Resistance  - 3 x weekly - 2 sets - 10 reps - 3 sec hold - Standing Diagonal Chop  - 3 x weekly - 2 sets - 10 reps - Mini Band Reach Overhead Full ROM  - 3 x weekly - 2 sets - 10 reps   GOALS: Goals reviewed with patient? Yes   LONG TERM GOALS: Target date: 05/17/23  Pt will be independent in strengthening and coordination HEP. Baseline:  Goal status: IN PROGRESS  2.  Pt will demonstrate improved overhead strength and endurance as needed to put away dishes in an overhead cabinet Baseline:  Goal status: IN PROGRESS  3.  Pt will demonstrate improved fine motor coordination for ADLs as evidenced by decreasing 9 hole peg test score for LUE by 3 secs Baseline: Right: 31.06 sec; Left: 36.22 Goal status: IN PROGRESS  4.  Pt will demonstrate improved UE functional use for ADLs as evidenced by increasing box/ blocks score by 3 blocks with LUE. Baseline: R: 43 blocks, L: 40 blocks Goal status: IN PROGRESS  5.  Pt will report and/or demonstrate improved functional use of LUE as evidenced by improved score on FOTO to 78 or greater by discharge. Baseline: 72 Goal status:  IN PROGRESS    ASSESSMENT:  CLINICAL IMPRESSION: Pt reports that he was initially having difficulty with shoulder external rotation but was able to decrease reps and remove resistance and has been able to increase tolerance to completing a set of 10 with red resistance just the  other day!  Pt able to complete shoulder flexion/extension, abduction, horizontal abduction, and scaption against resistance and without resistance.  OT breaking exercises down into A and B days to complete ROM daily, with increased resistance 3x/week.  Pt pleased with exercises and excited about increased ROM and exercises targeting his occupation and LUE limitations.    PERFORMANCE DEFICITS: in functional skills including IADLs, coordination, proprioception, strength, Fine motor control, Gross motor control, body mechanics, endurance, decreased knowledge of precautions, decreased knowledge of use of DME, and UE functional use and psychosocial skills including environmental adaptation.    PLAN:  OT FREQUENCY: 1-2x/week  OT DURATION: 4 weeks  PLANNED INTERVENTIONS: 97168 OT Re-evaluation, 97535 self care/ADL training, 40981 therapeutic exercise, 97530 therapeutic activity, 97112 neuromuscular re-education, 97035 ultrasound, 97010 moist heat, 97010 cryotherapy, 97032 electrical stimulation (manual), functional mobility training, psychosocial skills training, energy conservation, coping strategies training, patient/family education, and DME and/or AE instructions  RECOMMENDED OTHER SERVICES: NA  CONSULTED AND AGREED WITH PLAN OF CARE: Patient  PLAN FOR NEXT SESSION: review new theraband exercises, initiate coordination HEP, engage in functional overhead reaching   [place], Vernon Ariel, OTR/L 04/26/2023, 10:31 AM   [facility] at [facility] [address] [place], [place] [address] Phone # [phone] Fax # [phone]

## 2023-04-30 ENCOUNTER — Ambulatory Visit: Payer: 59 | Admitting: Occupational Therapy

## 2023-05-03 ENCOUNTER — Ambulatory Visit: Payer: 59 | Admitting: Occupational Therapy

## 2023-05-03 DIAGNOSIS — I69354 Hemiplegia and hemiparesis following cerebral infarction affecting left non-dominant side: Secondary | ICD-10-CM | POA: Diagnosis not present

## 2023-05-03 DIAGNOSIS — R278 Other lack of coordination: Secondary | ICD-10-CM

## 2023-05-03 DIAGNOSIS — M6281 Muscle weakness (generalized): Secondary | ICD-10-CM

## 2023-05-03 NOTE — Therapy (Signed)
OUTPATIENT OCCUPATIONAL THERAPY NEURO  Treatment Note  Patient Name: Samuel Clarke MRN: [id number] DOB:May 24, 1961, 62 y.o., male Today's Date: 05/03/2023  PCP: Caroline More, MD REFERRING PROVIDER: Mathews Argyle, NP  END OF SESSION:  OT End of Session - 05/03/23 1150     Visit Number 3    Number of Visits 7    Date for OT Re-Evaluation 05/17/23    Authorization Type [employer]    OT Start Time 1019    OT Stop Time 1101    OT Time Calculation (min) 42 min    Activity Tolerance Patient tolerated treatment well               Past Medical History:  Diagnosis Date   Alcohol use    Gastric mass    /notes 02/04/2017   Heart palpitations 06/2016   Normocytic anemia 01/2017   Obesity 06/2016   Patient is Jehovah's Witness    Pleurisy ~ 2008   Refusal of blood transfusions as patient is Jehovah's Witness    Rosacea    Past Surgical History:  Procedure Laterality Date   ABDOMINAL SURGERY     ESOPHAGOGASTRODUODENOSCOPY     ESOPHAGOGASTRODUODENOSCOPY N/A 02/04/2017   Procedure: ESOPHAGOGASTRODUODENOSCOPY (EGD);  Surgeon: Meryl Dare, MD;  Location[facility] ENDOSCOPY;  Service: Endoscopy;  Laterality: N/A;   TONSILLECTOMY     At age 30 or 7   Patient Active Problem List   Diagnosis Date Noted   Hypercoagulable state due to persistent atrial fibrillation (HCC) 04/23/2023   Persistent atrial fibrillation (HCC) 04/03/2023   Essential hypertension 04/03/2023   Hyperlipidemia 04/03/2023   Diabetes (HCC) 04/03/2023   Obesity 04/03/2023   Acute ischemic stroke (HCC) 04/01/2023   Hyponatremia 02/04/2017   Normocytic anemia 02/04/2017   Leukocytosis 02/04/2017   Alcohol use 02/04/2017   Patient is Jehovah's Witness 02/04/2017   Gastric mass 02/04/2017   Chest pain 02/04/2017   Abdominal pain 02/04/2017   Abnormal CT scan, stomach     ONSET DATE: 04/01/23  REFERRING DIAG: I63.9 (ICD-10-CM) - Acute ischemic stroke  THERAPY DIAG:  Hemiplegia and hemiparesis  following cerebral infarction affecting left non-dominant side (HCC)  Muscle weakness (generalized)  Other lack of coordination  Rationale for Evaluation and Treatment: Rehabilitation  SUBJECTIVE:   SUBJECTIVE STATEMENT: Pt reports "the exercises have been helping" and reports getting some "real life" practice with 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   Pt accompanied by: self  PERTINENT HISTORY: . MRI 10/8:small 1.5 cm Acute cortical and subcortical white matter infarct at the right superior frontal gyrus. PMHx: GIST, ETOH abuse, obesity.  PRECAUTIONS: None  WEIGHT BEARING RESTRICTIONS: No  PAIN:  Are you having pain? No  FALLS: Has patient fallen in last 6 months? No  LIVING ENVIRONMENT: Lives with: lives with their spouse Lives in: House/apartment Stairs: Yes: Internal: 12 steps down basement steps; on left going up and External: 3 steps to back, front has 4 steps; none Has following equipment at home:  uses a hiking stick when walking to/from mail box on gravel  PLOF: Independent, Independent with basic ADLs, and Vocation/Vocational requirements: window cleaner  PATIENT GOALS: to regain full control of L arm  OBJECTIVE:  Note: Objective measures were completed at Evaluation unless otherwise noted.  HAND DOMINANCE: Right  ADLs: Overall ADLs: Independent with bathing/dressing and transfers Transfers/ambulation related to ADLs: Independent Eating: Mod I - is  attempting to eat occasionally with LUE to address motor control  IADLs: Light housekeeping: washing dishes Meal Prep: enjoys cooking. Is engaging in chopping foods without any issue. Community mobility: still driving Medication management: Independent with opening pill bottles, uses alarm for recall to take meds as prescribed Handwriting: 100% legible  MOBILITY STATUS: Independent  POSTURE COMMENTS:  No Significant postural  limitations  ACTIVITY TOLERANCE: Activity tolerance: diminished  FUNCTIONAL OUTCOME MEASURES: FOTO: 72, predicted outcome 82 in 13 visits  UPPER EXTREMITY ROM:    Active ROM Right eval Left eval  Shoulder flexion [facility]  Shoulder abduction [facility]  Shoulder adduction    Shoulder extension    Shoulder internal rotation [facility] WFL  Shoulder external rotation [facility] WFL  Elbow flexion WFL WFL  Elbow extension [facility] WFL  Wrist flexion    Wrist extension    Wrist ulnar deviation    Wrist radial deviation    Wrist pronation    Wrist supination    (Blank rows = not tested)  UPPER EXTREMITY MMT:     MMT Right eval Left eval  Shoulder flexion 5/5 4/5  Shoulder abduction    Shoulder adduction    Shoulder extension    Shoulder internal rotation    Shoulder external rotation    Middle trapezius    Lower trapezius    Elbow flexion 5/5 4/5  Elbow extension 5/5 4/5  Wrist flexion    Wrist extension    Wrist ulnar deviation    Wrist radial deviation    Wrist pronation    Wrist supination    (Blank rows = not tested)  HAND FUNCTION: Grip strength: Right: 78 lbs; Left: 76 lbs  COORDINATION: Finger Nose Finger test: mild dysmetria on L 9 Hole Peg test: Right: 31.06 sec; Left: 36.22 (mild overshooting) sec Box and Blocks:  Right 43 blocks, Left 40 (mild hesitancy with movement)blocks Dysdiadochokinesia with rapid alternating hand movements, mild impairment on L  SENSATION: WFL  COGNITION: Overall cognitive status: Within functional limits for tasks assessed  VISION: Subjective report: no changes, wears glasses all the time   TODAY'S TREATMENT:                                                                            DATE:  05/03/23 Dumbbells: engaged in scapular protraction in supine for improved stability.  Completed 1 set of 10 with 3# and then 1 set of 10 with 5# with good technique and no c/o pain.  Completed supine shoulder horizontal abduction 1 set of 10 with 3#,  increased difficulty with 5# therefore encouraged use of 3# to begin with.   Quadruped: engaged in shoulder row with LUE, thread the needle, and alternating shoulder taps in quadruped on mat table.  Pt completed x5 with each exercise with min cues for technique.   Coordination: engaged in picking up small items one at a time to place in container, stacking/unstacking coins, in-hand manipulation with coins and translating back to finger tips to place into container.  Pt demonstrating good coordination with each of these exercises.  Completed in-hand manipulation with 2 golf balls, rotating balls clockwise and then counter clockwise.     04/26/23 External rotation: Pt reports that  he was initially having difficulty with shoulder external rotation but was able to decrease reps and remove resistance and has been able to increase tolerance to completing a set of 10 with red resistance just the other day! Pt demonstrating improved quality of movements with external rotation against resistance this session. 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  Pt completing set of 5 on 10# resistance with min cues to focus on shoulder and UB movements for strengthening, not compensating with trunk rotation. Resistance bands: engaged in shoulder extension and horizontal abduction/adduction with BUE with green theraband and then blue theraband anchored over door to further simulate window washing in a fashion that he can complete at home.  Pt completing x5 with good technique and modifications to further simulate occupation.  OT further instructed pt in lat pull downs and mini band reach overhead to address serratus strength and stability.  Pt completing each x5.   Dowel: OT instructed pt in shoulder flexion, abduction, and scaption with dowel to further address ROM without  resistance to focus on quality of movement to carry over to work tasks.    04/16/23 LUE strengthening: engaged in instruction of BUE and LUE strengthening with use of red theraband.  OT providing demonstration with pt demonstrating good understanding of each movement with shoulder horizontal abduction, external rotation and scapular retraction, and L shoulder PNF flexion.  OT also educating on scapular retraction without resistance for improved upright positioning and shoulder stability for progression into reaching.   PATIENT EDUCATION: Education details: ongoing condition specific education Person educated: Patient Education method: Explanation, Verbal cues, and Handouts Education comprehension: verbalized understanding and needs further education  HOME EXERCISE PROGRAM: 05/03/23 - coordination HEP  see pt instructions  Access Code: [email or web address] URL: [email or web address] Date: 05/03/2023 Prepared by: [facility] - Outpatient  Rehab - [facility]  Exercises - Seated Scapular Retraction  - 3 x weekly - 2 sets - 10 reps - Standing Shoulder Horizontal Abduction with Resistance  - 3 x weekly - 2 sets - 10 reps - 3 sec hold - Shoulder External Rotation and Scapular Retraction with Resistance  - 3 x weekly - 2 sets - 10 reps - 3 sec hold - Standing Shoulder Single Arm PNF D2 Flexion with Resistance  - 3 x weekly - 2 sets - 10 reps - 3 sec hold - Standing Shoulder Abduction AAROM with Dowel  - 3 x weekly - 2 sets - 10 reps - 3-5 second hold - Shoulder Flexion Overhead with Dowel  - 3 x weekly - 2 sets - 10 reps - 3-5 second hold - Shoulder Scaption AAROM with Dowel  - 3 x weekly - 2 sets - 10 reps - 3-5 seconds hold - Staggered Lat Pull Down with Resistance - Elbows Bent  - 3 x weekly - 2 sets - 10 reps - Single Arm Shoulder Extension with Anchored Resistance  - 3 x weekly - 2 sets - 10 reps - 3 sec hold - Standing Diagonal Chop  - 3 x weekly - 2 sets - 10 reps - Mini Band  Reach Overhead Full ROM  - 3 x weekly - 2 sets - 10 reps - Single Arm Serratus Punches in Supine with Dumbbell  - 3 x weekly - 2 sets - 10 reps - Supine Shoulder Horizontal Abduction with Dumbbells  - 3 x weekly - 2 sets -  10 reps - Architect - Reach Under  - 3 x weekly - 2 sets - 10 reps - Quadruped Shoulder Taps  - 3 x weekly - 2 sets - 10 reps - Bent Over Single Arm Shoulder Row with Dumbbell  - 3 x weekly - 2 sets - 10 reps   GOALS: Goals reviewed with patient? Yes   LONG TERM GOALS: Target date: 05/17/23  Pt will be independent in strengthening and coordination HEP. Baseline:  Goal status: IN PROGRESS  2.  Pt will demonstrate improved overhead strength and endurance as needed to put away dishes in an overhead cabinet Baseline:  Goal status: IN PROGRESS  3.  Pt will demonstrate improved fine motor coordination for ADLs as evidenced by decreasing 9 hole peg test score for LUE by 3 secs Baseline: Right: 31.06 sec; Left: 36.22 Goal status: IN PROGRESS  4.  Pt will demonstrate improved UE functional use for ADLs as evidenced by increasing box/ blocks score by 3 blocks with LUE. Baseline: R: 43 blocks, L: 40 blocks Goal status: IN PROGRESS  5.  Pt will report and/or demonstrate improved functional use of LUE as evidenced by improved score on FOTO to 78 or greater by discharge. Baseline: 72 Goal status: IN PROGRESS    ASSESSMENT:  CLINICAL IMPRESSION: Pt reports improvements in mobility and stability in LUE, even noticing increased ease with donning jacket and working on his truck from supine underneath the car.  Pt pleased with exercises, demonstrating improved tolerance of WB through BUE in quadruped and with isolated exercises in supine.  Pt demonstrating good technique with coordination tasks.  PERFORMANCE DEFICITS: in functional skills including IADLs, coordination, proprioception, strength, Fine motor control, Gross motor control, body mechanics,  endurance, decreased knowledge of precautions, decreased knowledge of use of DME, and UE functional use and psychosocial skills including environmental adaptation.    PLAN:  OT FREQUENCY: 1-2x/week  OT DURATION: 4 weeks  PLANNED INTERVENTIONS: 97168 OT Re-evaluation, 97535 self care/ADL training, 16109 therapeutic exercise, 97530 therapeutic activity, 97112 neuromuscular re-education, 97035 ultrasound, 97010 moist heat, 97010 cryotherapy, 97032 electrical stimulation (manual), functional mobility training, psychosocial skills training, energy conservation, coping strategies training, patient/family education, and DME and/or AE instructions  RECOMMENDED OTHER SERVICES: NA  CONSULTED AND AGREED WITH PLAN OF CARE: Patient  PLAN FOR NEXT SESSION: review therband and weight exercises, review coordination HEP, engage in functional overhead reaching   Blanding, Lotus Santillo, OTR/L 05/03/2023, 11:51 AM   [facility] at [facility] [address] [place], [place] [address] Phone # [phone] Fax # [phone]

## 2023-05-05 NOTE — Progress Notes (Unsigned)
Office Visit    Patient Name: Samuel Clarke Date of Encounter: 05/06/2023  Primary Care Provider:  Caroline More, MD Primary Cardiologist:  Reatha Harps, MD  Chief Complaint    62 y.o. male with history of GIST, CVA, hyperlipidemia, alcohol use, hypertension, type 2 diabetes, and atrial fibrillation.  Past Medical History  Subjective   Past Medical History:  Diagnosis Date   Alcohol use    Gastric mass    /notes 02/04/2017   Heart palpitations 06/2016   Normocytic anemia 01/2017   Obesity 06/2016   Patient is Jehovah's Witness    Pleurisy ~ 2008   Refusal of blood transfusions as patient is Jehovah's Witness    Rosacea    Past Surgical History:  Procedure Laterality Date   ABDOMINAL SURGERY     ESOPHAGOGASTRODUODENOSCOPY     ESOPHAGOGASTRODUODENOSCOPY N/A 02/04/2017   Procedure: ESOPHAGOGASTRODUODENOSCOPY (EGD);  Surgeon: Meryl Dare, MD;  Location: [facility] ENDOSCOPY;  Service: Endoscopy;  Laterality: N/A;   TONSILLECTOMY     At age 62 or 7    Allergies  No Known Allergies    History of Present Illness      62 y.o. y/o male with history of GIST, CVA, hyperlipidemia, alcohol use, hypertension, type 2 diabetes, and atrial fibrillation.  On 04/01/2023 he presented to the ED with acute weakness and tingling to his left arm.  His CTF was inconclusive but neuroexam was positive so he received TNK in the ED.  CTA head and neck showing no LVO.  MRI showed a small 1.5 cm acute cortical and subcortical white matter infarct at superior frontal gyrus with petechial hemorrhage but no malignant hemorrhagic transformation.  He was found to be in rapid atrial fibrillation.  He did report irregular heartbeat sensation all the way back to 2018.  There was a strong suspicion for prior paroxysmal episodes based on description of symptoms of palpitations and disproportionate fatigue in the past.  TTE performed on 04/01/2023 showing LVEF 55-60%, LV function normal, no RWMA, mild  LVH, RV SF normal, normal PASP, left atrial size severely dilated, trivial mitral valve regurgitation.  Toprol-XL 50 mg and Eliquis 5 mg twice daily started.  Admission LDL was 152, Crestor 20 mg added.     Today patient is doing well overall, he presents with his wife.  He remains in rate controlled atrial fibrillation.  He denies any tachycardia, palpitations, syncope.  He notes that his snoring has improved since his admission.  He notes compliance to his Eliquis.  He is seeing occupational therapy for his CVA, notes residual deficits have greatly improved.  He tells me that he has completely stopped drinking alcohol since his admission 1 month ago.  He has yet to establish with a PCP to follow his new diagnosis of type 2 diabetes.  He notes that his fatigue/shortness of breath have improved.  He denies any chest pain.  Objective  Home Medications    Current Outpatient Medications  Medication Sig Dispense Refill   apixaban (ELIQUIS) 5 MG TABS tablet Take 1 tablet (5 mg total) by mouth 2 (two) times daily. 180 tablet 0   metoprolol succinate (TOPROL-XL) 50 MG 24 hr tablet Take 1 tablet (50 mg total) by mouth daily. Take with or immediately following a meal. 90 tablet 0   rosuvastatin (CRESTOR) 20 MG tablet Take 1 tablet (20 mg total) by mouth daily. 90 tablet 0   No current facility-administered medications for this visit.     Physical  Exam    VS:  BP 124/78 (BP Location: Left Arm, Patient Position: Sitting, Cuff Size: Normal)   Pulse 83   Ht 5\' 10"  (1.778 m)   Wt 206 lb (93.4 kg)   BMI 29.56 kg/m  , BMI Body mass index is 29.56 kg/m. STOP-Bang Score:  5        GEN: Well nourished, well developed, in no acute distress. HEENT: normal. Neck: Supple, no JVD, carotid bruits, or masses. Cardiac: RRR, no murmurs, rubs, or gallops. No clubbing, cyanosis, edema.  Radials 2+/PT 2+ and equal bilaterally.  Respiratory:  Respirations regular and unlabored, clear to auscultation  bilaterally. GI: Soft, nontender, nondistended, BS + x 4. MS: no deformity or atrophy. Skin: warm and dry, no rash. Neuro:  Strength and sensation are intact. Psych: Normal affect.  Accessory Clinical Findings    ECG personally reviewed by me today - EKG Interpretation Date/Time:  Monday May 06 2023 07:45:34 EST Ventricular Rate:  83 PR Interval:    QRS Duration:  82 QT Interval:  380 QTC Calculation: 446 R Axis:   68  Text Interpretation: Atrial fibrillation Confirmed by Rise Paganini [id number]) on 05/06/2023 8:33:14 AM  - no acute changes.  Lab Results  Component Value Date   WBC 10.7 (H) 04/03/2023   HGB 13.4 04/03/2023   HCT 40.8 04/03/2023   MCV 87.4 04/03/2023   PLT 264 04/03/2023   Lab Results  Component Value Date   CREATININE 0.80 04/01/2023   CREATININE 0.84 04/01/2023   BUN 9 04/01/2023   BUN 9 04/01/2023   NA 136 04/01/2023   NA 135 04/01/2023   K 4.2 04/01/2023   K 4.2 04/01/2023   CL 101 04/01/2023   CL 97 (L) 04/01/2023   CO2 22 04/01/2023   Lab Results  Component Value Date   ALT 36 04/01/2023   AST 37 04/01/2023   ALKPHOS 62 04/01/2023   BILITOT 0.4 04/01/2023   Lab Results  Component Value Date   CHOL 236 (H) 04/02/2023   HDL 64 04/02/2023   LDLCALC 152 (H) 04/02/2023   TRIG 98 04/02/2023   TRIG 101 04/02/2023   CHOLHDL 3.7 04/02/2023    Lab Results  Component Value Date   HGBA1C 7.1 (H) 04/01/2023   Lab Results  Component Value Date   TSH 2.334 02/05/2017       Assessment & Plan    1.  Persistent Atrial Fibrillation -CHA2DS2-VASc Score = 4. Therefore, the patient's annual risk of stroke is 4.8 %.    -He remains in rate controlled atrial fibrillation today without symptoms of tachycardia, chest pain, fatigue, SOB  -He is interested in DCCV, with recent CVA will need neurology clearance prior to procedure -High likelihood of recurrence post DCCV given severe left atrial dilation and untreated OSA -Will have him  follow-up in 6 weeks post neurology appt on (12/13) and sleep study to discuss DCCV further -Continue Eliquis 5 mg twice daily, metoprolol 50 mg daily  2. Hypertension  -BP today 124/78 -Well-controlled, continue metoprolol 50 mg daily  3. Hyperlipidemia -LDL 152 on 04/02/2023 -Started on rosuvastatin x 1 month ago, denies myalgias -Plan for direct LDL today, can increase statin therapy to get goal -Continue rosuvastatin 20 mg daily -Heart healthy diet encouraged  4. Suspected OSA / Snoring  -STOP-BANG 5 -[place] home sleep study ordered today -Likely contributing to Afib  5. T2DM -A1c 7.1 on 04/01/23, currently not on therapy -Has yet to establish with PCP -He was given number  to [facility] to establish care  6.  Alcohol use -He has not used alcohol since his CVA 1 month ago     Dispo: 6 weeks with [place], [place]  Denyce Robert, NP 05/06/2023, 9:36 AM

## 2023-05-06 ENCOUNTER — Ambulatory Visit: Payer: 59 | Attending: Physician Assistant | Admitting: Emergency Medicine

## 2023-05-06 ENCOUNTER — Encounter: Payer: Self-pay | Admitting: Physician Assistant

## 2023-05-06 VITALS — BP 124/78 | HR 83 | Ht 70.0 in | Wt 206.0 lb

## 2023-05-06 DIAGNOSIS — E782 Mixed hyperlipidemia: Secondary | ICD-10-CM

## 2023-05-06 DIAGNOSIS — I4819 Other persistent atrial fibrillation: Secondary | ICD-10-CM

## 2023-05-06 DIAGNOSIS — E119 Type 2 diabetes mellitus without complications: Secondary | ICD-10-CM

## 2023-05-06 DIAGNOSIS — D6869 Other thrombophilia: Secondary | ICD-10-CM

## 2023-05-06 DIAGNOSIS — R0683 Snoring: Secondary | ICD-10-CM

## 2023-05-06 DIAGNOSIS — I639 Cerebral infarction, unspecified: Secondary | ICD-10-CM

## 2023-05-06 DIAGNOSIS — I10 Essential (primary) hypertension: Secondary | ICD-10-CM

## 2023-05-06 DIAGNOSIS — Z789 Other specified health status: Secondary | ICD-10-CM

## 2023-05-06 DIAGNOSIS — F1090 Alcohol use, unspecified, uncomplicated: Secondary | ICD-10-CM

## 2023-05-06 NOTE — Patient Instructions (Addendum)
Medication Instructions:  NO CHANGES *If you need a refill on your cardiac medications before your next appointment, please call your pharmacy*   Lab Work: DIRECT LDL TODAY. If you have labs (blood work) drawn today and your tests are completely normal, you will receive your results only by: MyChart Message (if you have MyChart) OR A paper copy in the mail If you have any lab test that is abnormal or we need to change your treatment, we will call you to review the results.   Testing/Procedures: WatchPAT?  Is a FDA cleared portable home sleep study test that uses a watch and 3 points of contact to monitor 7 different channels, including your heart rate, oxygen saturations, body position, snoring, and chest motion.  The study is easy to use from the comfort of your own home and accurately detect sleep apnea.  Before bed, you attach the chest sensor, attached the sleep apnea bracelet to your nondominant hand, and attach the finger probe.  After the study, the raw data is downloaded from the watch and scored for apnea events.   For more information: [email or web address]  Patient Testing Instructions:  Do not put battery into the device until bedtime when you are ready to begin the test. Please call the support number if you need assistance after following the instructions below: 24 hour support line- [phone] or [facility] support at [phone] (option 2)  Download the IntelWatchPAT One" app through the google play store or App Store  Be sure to turn on or enable access to bluetooth in settlings on your smartphone/ device  Make sure no other bluetooth devices are on and within the vicinity of your smartphone/ device and WatchPAT watch during testing.  Make sure to leave your smart phone/ device plugged in and charging all night.  When ready for bed:  Follow the instructions step by step in the WatchPAT One App to activate the testing device. For additional instructions,  including video instruction, visit the WatchPAT One video on Youtube. You can search for WatchPat One within Youtube (video is 4 minutes and 18 seconds) or enter: https://youtube/watch?v=BCce_vbiwxE Please note: You will be prompted to enter a Pin to connect via bluetooth when starting the test. The PIN will be assigned to you when you receive the test.  The device is disposable, but it recommended that you retain the device until you receive a call letting you know the study has been received and the results have been interpreted.  We will let you know if the study did not transmit to [place] properly after the test is completed. You do not need to call us to confirm the receipt of the test.  Please complete the test within 48 hours of receiving PIN.   Frequently Asked Questions:  What is Watch Dennie Bible one?  A single use fully disposable home sleep apnea testing device and will not need to be returned after completion.  What are the requirements to use WatchPAT one?  The be able to have a successful watchpat one sleep study, you should have your Watch pat one device, your smart phone, watch pat one app, your PIN number and Internet access What type of phone do I need?  You should have a smart phone that uses Android 5.1 and above or any Iphone with IOS 10 and above How can I download the WatchPAT one app?  Based on your device type search for WatchPAT one app either in google play for android devices or APP store for  Iphone's Where will I get my PIN for the study?  Your PIN will be provided by your physician's office. It is used for authentication and if you lose/forget your PIN, please reach out to your providers office.  I do not have Internet at home. Can I do WatchPAT one study?  WatchPAT One needs Internet connection throughout the night to be able to transmit the sleep data. You can use your home/local internet or your cellular's data package. However, it is always recommended to use home/local  Internet. It is estimated that between 20MB-30MB will be used with each study.However, the application will be looking for space in the phone to start the study.  What happens if I lose internet or bluetooth connection?  During the internet disconnection, your phone will not be able to transmit the sleep data. All the data, will be stored in your phone. As soon as the internet connection is back on, the phone will being sending the sleep data. During the bluetooth disconnection, WatchPAT one will not be able to to send the sleep data to your phone. Data will be kept in the [facility] one until two devices have bluetooth connection back on. As soon as the connection is back on, WatchPAT one will send the sleep data to the phone.  How long do I need to wear the WatchPAT one?  After you start the study, you should wear the device at least 6 hours.  How far should I keep my phone from the device?  During the night, your phone should be within 15 feet.  What happens if I leave the room for restroom or other reasons?  Leaving the room for any reason will not cause any problem. As soon as your get back to the room, both devices will reconnect and will continue to send the sleep data. Can I use my phone during the sleep study?  Yes, you can use your phone as usual during the study. But it is recommended to put your watchpat one on when you are ready to go to bed.  How will I get my study results?  A soon as you completed your study, your sleep data will be sent to the provider. They will then share the results with you when they are ready.      Follow-Up: At [facility], you and your health needs are our priority.  As part of our continuing mission to provide you with exceptional heart care, we have created designated Provider Care Teams.  These Care Teams include your primary Cardiologist (physician) and Advanced Practice Providers (APPs -  Physician Assistants and Nurse Practitioners) who all  work together to provide you with the care you need, when you need it.   Your next appointment:   6 week(s)  Provider:   Azalee Course PA  Other Instructions FOLLOW UP WITH [facility] [phone]

## 2023-05-07 ENCOUNTER — Telehealth: Payer: Self-pay

## 2023-05-07 ENCOUNTER — Ambulatory Visit: Payer: 59 | Admitting: Occupational Therapy

## 2023-05-07 LAB — LDL CHOLESTEROL, DIRECT: LDL Direct: 98 mg/dL (ref 0–99)

## 2023-05-07 NOTE — Telephone Encounter (Addendum)
Called patient regarding results left vm for patient to give office a call back  ----- Message from Denyce Robert sent at 05/07/2023  3:58 PM EST ----- Your LDL cholesterol is 98, your previous reading one month ago was 152.  Your goal LDL will be less than 70.  We will not change your medication regimen at this time.  Please continue on the heart healthy dieting and physical exercise.  We will reassess your cholesterol again 1 year follow-up visit.

## 2023-05-09 ENCOUNTER — Ambulatory Visit: Payer: 59 | Admitting: Occupational Therapy

## 2023-05-09 ENCOUNTER — Telehealth: Payer: Self-pay | Admitting: Cardiovascular Disease

## 2023-05-09 DIAGNOSIS — R278 Other lack of coordination: Secondary | ICD-10-CM

## 2023-05-09 DIAGNOSIS — M6281 Muscle weakness (generalized): Secondary | ICD-10-CM

## 2023-05-09 DIAGNOSIS — I69354 Hemiplegia and hemiparesis following cerebral infarction affecting left non-dominant side: Secondary | ICD-10-CM | POA: Diagnosis not present

## 2023-05-09 NOTE — Telephone Encounter (Signed)
Please see 11/12 result TE for documentation

## 2023-05-09 NOTE — Therapy (Signed)
OUTPATIENT OCCUPATIONAL THERAPY NEURO  Treatment Note & Discharge  Patient Name: Aceson Kuhlmann MRN: [id number] DOB:05/13/1961, 62 y.o., male Today's Date: 05/09/2023  PCP: Caroline More, MD REFERRING PROVIDER: Mathews Argyle, NP  OCCUPATIONAL THERAPY DISCHARGE SUMMARY  Visits from Start of Care: 4  Current functional level related to goals / functional outcomes: Pt made great progress towards goals, meeting 5/5 LTGs with improvements in LUE strength and coordination to engage in ADLs, IADLs, and return to work.  Pt reporting improvements with tasks that were becoming challenging even prior to stroke.   Remaining deficits: NA   Education / Equipment: HEP for strength and ROM with theraband, weights, and WB, and Coordination HEP   Patient agrees to discharge. Patient goals were met. Patient is being discharged due to meeting the stated rehab goals..     END OF SESSION:  OT End of Session - 05/09/23 1443     Visit Number 4    Number of Visits 7    Date for OT Re-Evaluation 05/17/23    Authorization Type [employer]    OT Start Time 1445    OT Stop Time 1530    OT Time Calculation (min) 45 min    Activity Tolerance Patient tolerated treatment well                Past Medical History:  Diagnosis Date   Alcohol use    Gastric mass    /notes 02/04/2017   Heart palpitations 06/2016   Normocytic anemia 01/2017   Obesity 06/2016   Patient is Jehovah's Witness    Pleurisy ~ 2008   Refusal of blood transfusions as patient is Jehovah's Witness    Rosacea    Past Surgical History:  Procedure Laterality Date   ABDOMINAL SURGERY     ESOPHAGOGASTRODUODENOSCOPY     ESOPHAGOGASTRODUODENOSCOPY N/A 02/04/2017   Procedure: ESOPHAGOGASTRODUODENOSCOPY (EGD);  Surgeon: Meryl Dare, MD;  Location: [facility];  Service: Endoscopy;  Laterality: N/A;   TONSILLECTOMY     At age 38 or 7   Patient Active Problem List   Diagnosis Date Noted   Hypercoagulable state  due to persistent atrial fibrillation (HCC) 04/23/2023   Persistent atrial fibrillation (HCC) 04/03/2023   Essential hypertension 04/03/2023   Hyperlipidemia 04/03/2023   Diabetes (HCC) 04/03/2023   Obesity 04/03/2023   Acute ischemic stroke (HCC) 04/01/2023   Hyponatremia 02/04/2017   Normocytic anemia 02/04/2017   Leukocytosis 02/04/2017   Alcohol use 02/04/2017   Patient is Jehovah's Witness 02/04/2017   Gastric mass 02/04/2017   Chest pain 02/04/2017   Abdominal pain 02/04/2017   Abnormal CT scan, stomach     ONSET DATE: 04/01/23  REFERRING DIAG: I63.9 (ICD-10-CM) - Acute ischemic stroke  THERAPY DIAG:  Hemiplegia and hemiparesis following cerebral infarction affecting left non-dominant side (HCC)  Muscle weakness (generalized)  Other lack of coordination  Rationale for Evaluation and Treatment: Rehabilitation  SUBJECTIVE:   SUBJECTIVE STATEMENT: Pt reports "I had to go back to work" reporting needing to get some money coming in.  Pt reports improvements in LUE motor control, strength, and pain, stating "Those exercises are spot on". Pt accompanied by: self  PERTINENT HISTORY: . MRI 10/8:small 1.5 cm Acute cortical and subcortical white matter infarct at the right superior frontal gyrus. PMHx: GIST, ETOH abuse, obesity.  PRECAUTIONS: None  WEIGHT BEARING RESTRICTIONS: No  PAIN:  Are you having pain? No  FALLS: Has patient fallen in last 6 months? No  LIVING ENVIRONMENT: Lives with: lives  with their spouse Lives in: House/apartment Stairs: Yes: Internal: 12 steps down basement steps; on left going up and External: 3 steps to back, front has 4 steps; none Has following equipment at home:  uses a hiking stick when walking to/from mail box on gravel  PLOF: Independent, Independent with basic ADLs, and Vocation/Vocational requirements: window cleaner  PATIENT GOALS: to regain full control of L arm  OBJECTIVE:  Note: Objective measures were completed at  Evaluation unless otherwise noted.  HAND DOMINANCE: Right  ADLs: Overall ADLs: Independent with bathing/dressing and transfers Transfers/ambulation related to ADLs: Independent Eating: Mod I - is attempting to eat occasionally with LUE to address motor control  IADLs: Light housekeeping: washing dishes Meal Prep: enjoys cooking. Is engaging in chopping foods without any issue. Community mobility: still driving Medication management: Independent with opening pill bottles, uses alarm for recall to take meds as prescribed Handwriting: 100% legible  MOBILITY STATUS: Independent  POSTURE COMMENTS:  No Significant postural limitations  ACTIVITY TOLERANCE: Activity tolerance: diminished  FUNCTIONAL OUTCOME MEASURES: 05/09/23:  FOTO: 98  FOTO: 72, predicted outcome 82 in 13 visits  UPPER EXTREMITY ROM:    Active ROM Right eval Left eval  Shoulder flexion [facility]  Shoulder abduction [facility]  Shoulder adduction    Shoulder extension    Shoulder internal rotation [facility]  Shoulder external rotation [facility]  Elbow flexion [facility] WFL  Elbow extension [facility] WFL  Wrist flexion    Wrist extension    Wrist ulnar deviation    Wrist radial deviation    Wrist pronation    Wrist supination    (Blank rows = not tested)  UPPER EXTREMITY MMT:     MMT Right eval Left eval Left 05/09/23  Shoulder flexion 5/5 4/5 5/5  Shoulder abduction   5/5  Shoulder adduction     Shoulder extension     Shoulder internal rotation     Shoulder external rotation     Middle trapezius     Lower trapezius     Elbow flexion 5/5 4/5 5/5  Elbow extension 5/5 4/5 5/5  Wrist flexion     Wrist extension     Wrist ulnar deviation     Wrist radial deviation     Wrist pronation     Wrist supination     (Blank rows = not tested)  HAND FUNCTION: Grip strength: Right: 78 lbs; Left: 76 lbs  COORDINATION: 05/09/23 Finger Nose Finger test: [facility] 9 Hole Peg test: Left: 30.43 sec Box and Blocks:  Left 46  blocks Rapid alternating hand movements: WFL  Finger Nose Finger test: mild dysmetria on L 9 Hole Peg test: Right: 31.06 sec; Left: 36.22 (mild overshooting) sec Box and Blocks:  Right 43 blocks, Left 40 (mild hesitancy with movement)blocks Dysdiadochokinesia with rapid alternating hand movements, mild impairment on L  SENSATION: WFL  COGNITION: Overall cognitive status: Within functional limits for tasks assessed  VISION: Subjective report: no changes, wears glasses all the time   TODAY'S TREATMENT:                                                                            DATE:  05/09/23 Coordination: engaged in Statistician  with rotating 2 golf balls clockwise and counter-clockwise.  Pt with mild difficulty, however with no drops. 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  Pt completing set of 10 on 10# resistance with shoulder extension and set of 10 on 5# resistance with horizontal abduction and adduction with min cues to focus on shoulder and UB movements for strengthening, not compensating with trunk rotation. Theraband: reviewed mini band reach overhead with red theraband, modifying placement of band to allow for decreased resistance and increased ROM/tolerance to ROM while maintaining elbow alignment.  Pt demonstrating improved technique and tolerance to ROM. Therapeutic activity: engaged in reaching into overhead cabinet with LUE and with BUE to place various items in high shelf.  Pt with no c/o pain or issue.   05/03/23 Dumbbells: engaged in scapular protraction in supine for improved stability.  Completed 1 set of 10 with 3# and then 1 set of 10 with 5# with good technique and no c/o pain.  Completed supine shoulder horizontal abduction 1 set of 10 with 3#, increased difficulty with 5# therefore encouraged use of 3# to  begin with.   Quadruped: engaged in shoulder row with LUE, thread the needle, and alternating shoulder taps in quadruped on mat table.  Pt completed x5 with each exercise with min cues for technique.   Coordination: engaged in picking up small items one at a time to place in container, stacking/unstacking coins, in-hand manipulation with coins and translating back to finger tips to place into container.  Pt demonstrating good coordination with each of these exercises.  Completed in-hand manipulation with 2 golf balls, rotating balls clockwise and then counter clockwise.     04/26/23 External rotation: Pt reports that he was initially having difficulty with shoulder external rotation but was able to decrease reps and remove resistance and has been able to increase tolerance to completing a set of 10 with red resistance just the other day! Pt demonstrating improved quality of movements with external rotation against resistance this session. 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  Pt completing set of 5 on 10# resistance with min cues to focus on shoulder and UB movements for strengthening, not compensating with trunk rotation. Resistance bands: engaged in shoulder extension and horizontal abduction/adduction with BUE with green theraband and then blue theraband anchored over door to further simulate window washing in a fashion that he can complete at home.  Pt completing x5 with good technique and modifications to further simulate occupation.  OT further instructed pt in lat pull downs and mini band reach overhead to address serratus strength and stability.  Pt completing each x5.   Dowel: OT instructed pt in shoulder flexion, abduction, and scaption with dowel to further address ROM without resistance to focus on quality of movement to carry over to work  tasks.   PATIENT EDUCATION: Education details: ongoing condition specific education Person educated: Patient Education method: Explanation, Verbal cues, and Handouts Education comprehension: verbalized understanding and needs further education  HOME EXERCISE PROGRAM: 05/03/23 - coordination HEP  see pt instructions  Access Code: [email or web address] URL: [email or web address] Date: 05/03/2023 Prepared by: [facility]  Exercises - Seated Scapular Retraction  - 3 x weekly - 2 sets - 10 reps -  Standing Shoulder Horizontal Abduction with Resistance  - 3 x weekly - 2 sets - 10 reps - 3 sec hold - Shoulder External Rotation and Scapular Retraction with Resistance  - 3 x weekly - 2 sets - 10 reps - 3 sec hold - Standing Shoulder Single Arm PNF D2 Flexion with Resistance  - 3 x weekly - 2 sets - 10 reps - 3 sec hold - Standing Shoulder Abduction AAROM with Dowel  - 3 x weekly - 2 sets - 10 reps - 3-5 second hold - Shoulder Flexion Overhead with Dowel  - 3 x weekly - 2 sets - 10 reps - 3-5 second hold - Shoulder Scaption AAROM with Dowel  - 3 x weekly - 2 sets - 10 reps - 3-5 seconds hold - Staggered Lat Pull Down with Resistance - Elbows Bent  - 3 x weekly - 2 sets - 10 reps - Single Arm Shoulder Extension with Anchored Resistance  - 3 x weekly - 2 sets - 10 reps - 3 sec hold - Standing Diagonal Chop  - 3 x weekly - 2 sets - 10 reps - Mini Band Reach Overhead Full ROM  - 3 x weekly - 2 sets - 10 reps - Single Arm Serratus Punches in Supine with Dumbbell  - 3 x weekly - 2 sets - 10 reps - Supine Shoulder Horizontal Abduction with Dumbbells  - 3 x weekly - 2 sets - 10 reps - Quadruped Thoracic Rotation - Reach Under  - 3 x weekly - 2 sets - 10 reps - Quadruped Shoulder Taps  - 3 x weekly - 2 sets - 10 reps - Bent Over Single Arm Shoulder Row with Dumbbell  - 3 x weekly - 2 sets - 10 reps   GOALS: Goals reviewed with patient? Yes   LONG TERM GOALS:  Target date: 05/17/23  Pt will be independent in strengthening and coordination HEP. Baseline:  Goal status: MET - 05/09/23  2.  Pt will demonstrate improved overhead strength and endurance as needed to put away dishes in an overhead cabinet Baseline:  Goal status: MET - 05/09/23  3.  Pt will demonstrate improved fine motor coordination for ADLs as evidenced by decreasing 9 hole peg test score for LUE by 3 secs Baseline: Right: 31.06 sec; Left: 36.22 Goal status: MET - 30.43 sec on 05/09/23  4.  Pt will demonstrate improved UE functional use for ADLs as evidenced by increasing box/ blocks score by 3 blocks with LUE. Baseline: R: 43 blocks, L: 40 blocks Goal status: MET - 46 blocks  5.  Pt will report and/or demonstrate improved functional use of LUE as evidenced by improved score on FOTO to 78 or greater by discharge. Baseline: 72 Goal status: MET - 98 on 05/09/23    ASSESSMENT:  CLINICAL IMPRESSION: Pt reports improvements in mobility and stability in LUE, even noticing increased ease with donning jacket and reports returning to work.  Pt with no c/o pain or issues with any work related tasks. Pt pleased with exercises, demonstrating improved tolerance against resistance with improved reps on weight stack with shoulder extension and horizontal abduction/adduction.  Pt tolerating resistance band, WB, and coordination HEP with good carryover.  Pt agreeable to d/c at this time due to progress towards goals and return to work.  PERFORMANCE DEFICITS: in functional skills including IADLs, coordination, proprioception, strength, Fine motor control, Gross motor control, body mechanics, endurance, decreased knowledge of precautions, decreased knowledge of use of DME, and UE functional  use and psychosocial skills including environmental adaptation.    PLAN:  OT FREQUENCY: 1-2x/week  OT DURATION: 4 weeks  PLANNED INTERVENTIONS: 97168 OT Re-evaluation, 97535 self care/ADL training, 16109  therapeutic exercise, 97530 therapeutic activity, 97112 neuromuscular re-education, 97035 ultrasound, 97010 moist heat, 97010 cryotherapy, 97032 electrical stimulation (manual), functional mobility training, psychosocial skills training, energy conservation, coping strategies training, patient/family education, and DME and/or AE instructions  RECOMMENDED OTHER SERVICES: NA  CONSULTED AND AGREED WITH PLAN OF CARE: Patient  PLAN FOR NEXT SESSION: D/C   Rosalio Loud, OTR/L 05/09/2023, 4:00 PM   [facility] at [facility] [address] [place], [place] [address] Phone # [phone] Fax # [phone]

## 2023-05-09 NOTE — Telephone Encounter (Signed)
Patient identification verified by 2 forms. Marilynn Rail, RN   Called and spoke to patient  Relayed provider result message below  Patient verbalized understanding, states he has no questions at this time  Encouraged patient to call with questions/concerns  Patient agrees

## 2023-05-09 NOTE — Telephone Encounter (Signed)
Pt returning nurses call from 11/12 regarding results. Please advise

## 2023-05-14 ENCOUNTER — Encounter: Payer: 59 | Admitting: Occupational Therapy

## 2023-05-17 ENCOUNTER — Ambulatory Visit: Payer: 59 | Admitting: Occupational Therapy

## 2023-06-07 ENCOUNTER — Encounter: Payer: Self-pay | Admitting: Neurology

## 2023-06-07 ENCOUNTER — Ambulatory Visit: Payer: 59 | Admitting: Neurology

## 2023-06-07 VITALS — BP 141/86 | HR 84 | Ht 70.0 in | Wt 200.0 lb

## 2023-06-07 DIAGNOSIS — I63411 Cerebral infarction due to embolism of right middle cerebral artery: Secondary | ICD-10-CM | POA: Diagnosis not present

## 2023-06-07 NOTE — Progress Notes (Signed)
[facility]  PATIENT: Samuel Clarke DOB: [birth year]  REQUESTING CLINICIAN: Caroline More, MD HISTORY FROM: Patient and chart review  REASON FOR VISIT: Right frontal cortical stroke    HISTORICAL  CHIEF COMPLAINT:  Chief Complaint  Patient presents with   [name]    Patient is here alone for hospital f/u for CVA. He states he doesn't really have lingering symptoms from the stroke. He states its almost like it never happened but sometimes he notices his L deltoid has a little pain and weakness. He did have some joint pain before the stroke but not any weakness with it. He can tell a difference in strength while using his bowflex but it has improved with therapy. 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  Patient was also found to have new A-fib, stroke etiology likely cardioembolic due to new A-fib.  His vessel images did not show any significant stenosis.  Patient started on Eliquis 5 mg twice daily, he has been compliant with the medication.  He does have a cardiology appointment scheduled at the end of the month for cardioversion.  He did completed occupational therapy, have recover extremely well and he is back to his work.  Currently no deficit, however he stated that at time he will feel that his left shoulder is weaker compared to the right but this has been going on prior to the stroke.   Brief hospital course and summary:  Samuel Clarke is a 62 y.o. male past medical history of a GIST currently on surveillance at [facility], no recent surgeries, no other significant medical history on no medications at home, presented to the emergency department 10/7 with last  known well 5 AM when he suddenly started noticing weakness and tingling of the left arm.  He said he was leaning on the wall with support from his left hand when it felt like something in the lines of when funny bone inside.  Initially started with tingling in the left hand became numb and difficult to operate.  His wife drove him to the emergency department.  He was seen at triage and a code stroke was activated after the MD examined him and found ataxia on examination TNK given 10/7 0655     HOSPITAL COURSE  Acute Ischemic Infarct:  right cortical and subcortical white matter s/p TNK Etiology: Likely cardioembolic in the setting of new onset A-fib Code Stroke CT head  No acute abnormality.   Small chronic right occipital cortex infarct CTA head & neck  No LVO   MRI   Small 1.5 cm acute cortical and subcortical white matter infarct right superior frontal gyrus Petechial hemorrhage with Heidelberg classification 1A but no malignant hemorrhagic transformation or mass effect Small chronic right occipital cortical infarct  OTHER MEDICAL CONDITIONS: atrial fibrillation on Eliquis, Hyperlipidemia, Diabetes    REVIEW OF SYSTEMS: Full 14 system review of systems performed and negative with exception of: As noted in the HPI   ALLERGIES: No Known Allergies  HOME MEDICATIONS: Outpatient Medications Prior to Visit  Medication Sig Dispense Refill   apixaban (ELIQUIS) 5 MG TABS tablet Take 1 tablet (5 mg total) by mouth 2 (two) times  daily. 180 tablet 0   metoprolol succinate (TOPROL-XL) 50 MG 24 hr tablet Take 1 tablet (50 mg total) by mouth daily. Take with or immediately following a meal. 90 tablet 0   rosuvastatin (CRESTOR) 20 MG tablet Take 1 tablet (20 mg total) by mouth daily. 90 tablet 0   No facility-administered medications prior to visit.    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   ESOPHAGOGASTRODUODENOSCOPY     ESOPHAGOGASTRODUODENOSCOPY N/A 02/04/2017   Procedure: ESOPHAGOGASTRODUODENOSCOPY (EGD);  Surgeon: Meryl Dare, MD;  Location: [facility] ENDOSCOPY;  Service: Endoscopy;  Laterality: N/A;   TONSILLECTOMY     At age 36 or 85    FAMILY HISTORY: Family History  Problem Relation Age of Onset   Stroke Mother        mild stroke   Diabetes Mellitus II Mother    Heart attack Mother    Transient ischemic attack Father    Diabetes Mellitus II Sister     SOCIAL HISTORY: Social History   Socioeconomic History   Marital status: Married    Spouse name: Not on file   Number of children: Not on file   Years of education: Not on file   Highest education level: Not on file  Occupational History   Not on file  Tobacco Use   Smoking status: Never   Smokeless tobacco: Never   Tobacco comments:    Never smoked 04/23/23  Vaping Use   Vaping status: Never Used  Substance and Sexual Activity   Alcohol use: Not Currently    Alcohol/week: 21.0 standard drinks of alcohol    Types: 21 Cans of beer per week    Comment: stop the day before stroke 04/23/23   Drug use: No   Sexual activity: Yes  Other Topics Concern   Not on file  Social History Narrative   Right handed   Lives with wife   Caffeine: 2 cups/day coffee   Social Drivers of Corporate investment banker Strain: Not on file  Food Insecurity: No Food Insecurity (04/02/2023)   Hunger Vital Sign    Worried About Running Out of Food in the Last Year: Never true    Ran Out of Food in the Last Year: Never true  Transportation Needs: No  Transportation Needs (04/02/2023)   PRAPARE - Administrator, Civil Service (Medical): No    Lack of Transportation (Non-Medical): No  Physical Activity: Not on file  Stress: Not on file  Social Connections: Not on file  Intimate Partner Violence: Not At Risk (04/02/2023)   Humiliation, Afraid, Rape, and Kick questionnaire    Fear of Current or Ex-Partner: No    Emotionally Abused: No    Physically Abused: No    Sexually Abused: No    PHYSICAL EXAM  GENERAL EXAM/CONSTITUTIONAL: Vitals:  Vitals:   06/07/23 0849  BP: (!) 141/86  Pulse: 84  Weight: 200 lb (90.7 kg)  Height: 5'  10" (1.778 m)   Body mass index is 28.7 kg/m. Wt Readings from Last 3 Encounters:  06/07/23 200 lb (90.7 kg)  05/06/23 206 lb (93.4 kg)  04/23/23 209 lb 6.4 oz (95 kg)   Patient is in no distress; well developed, nourished and groomed; neck is supple  MUSCULOSKELETAL: Gait, strength, tone, movements noted in Neurologic exam below  NEUROLOGIC: MENTAL STATUS:      No data to display         awake, alert, oriented to person, place and time recent and remote memory intact normal attention and concentration language fluent, comprehension intact, naming intact fund of knowledge appropriate  CRANIAL NERVE:  2nd, 3rd, 4th, 6th - Visual fields full to confrontation, extraocular muscles intact, no nystagmus 5th - facial sensation symmetric 7th - facial strength symmetric 8th - hearing intact 9th - palate elevates symmetrically, uvula midline 11th - shoulder shrug symmetric 12th - tongue protrusion midline  MOTOR:  normal bulk and tone, full strength in the BUE, BLE  SENSORY:  normal and symmetric to light touch  COORDINATION:  finger-nose-finger, fine finger movements normal  GAIT/STATION:  normal   DIAGNOSTIC DATA (LABS, IMAGING, TESTING) - I reviewed patient records, labs, notes, testing and imaging myself where available.  Lab Results  Component Value Date   WBC 10.7  (H) 04/03/2023   HGB 13.4 04/03/2023   HCT 40.8 04/03/2023   MCV 87.4 04/03/2023   PLT 264 04/03/2023      Component Value Date/Time   NA 136 04/01/2023 0639   NA 135 04/01/2023 0639   NA 140 10/03/2022 1449   K 4.2 04/01/2023 0639   K 4.2 04/01/2023 0639   CL 101 04/01/2023 0639   CL 97 (L) 04/01/2023 0639   CO2 22 04/01/2023 0639   GLUCOSE 146 (H) 04/01/2023 0639   GLUCOSE 148 (H) 04/01/2023 0639   BUN 9 04/01/2023 0639   BUN 9 04/01/2023 0639   BUN 12 10/03/2022 1449   CREATININE 0.80 04/01/2023 0639   CREATININE 0.84 04/01/2023 0639   CALCIUM 8.7 (L) 04/01/2023 0639   PROT 7.1 04/01/2023 0639   ALBUMIN 3.7 04/01/2023 0639   AST 37 04/01/2023 0639   ALT 36 04/01/2023 0639   ALKPHOS 62 04/01/2023 0639   BILITOT 0.4 04/01/2023 0639   GFRNONAA >60 04/01/2023 0639   GFRAA >60 02/05/2017 0336   Lab Results  Component Value Date   CHOL 236 (H) 04/02/2023   HDL 64 04/02/2023   LDLCALC 152 (H) 04/02/2023   LDLDIRECT 98 05/06/2023   TRIG 98 04/02/2023   TRIG 101 04/02/2023   CHOLHDL 3.7 04/02/2023   Lab Results  Component Value Date   HGBA1C 7.1 (H) 04/01/2023   No results found for: "VITAMINB12" Lab Results  Component Value Date   TSH 2.334 02/05/2017    MRI Brain 04/02/2023 1. Positive for a small 1.5 cm Acute cortical and subcortical white matter infarct at the right superior frontal gyrus - motor or pre motor area. Petechial hemorrhage (Heidelberg classification 1a: HI1), but no malignant hemorrhagic transformation or mass effect. But no mass effect.   2. No other acute intracranial abnormality. Small chronic right occipital cortical infarct also with questionable hemosiderin. Otherwise mild for age white matter signal changes   CTA Head and Neck 04/01/2023 1. No emergent finding. 2. Mild atherosclerosis without significant stenosis or irregularity of major arteries in the head and neck.  Echo 04/01/2023 1. Left ventricular ejection fraction, by estimation,  is 55 to 60%.  The left ventricle has normal function. The left ventricle has no regional wall motion abnormalities. There is mild concentric left ventricular hypertrophy. Left ventricular diastolic function could not be evaluated.  2. Right ventricular systolic function is normal. The right ventricular size is normal. There is normal pulmonary artery systolic pressure.  3. Left atrial size was severely dilated.  4. The mitral valve is normal in structure. Trivial mitral valve regurgitation. No evidence of mitral stenosis.  5. The aortic valve is normal in structure. Aortic valve regurgitation is not visualized. No aortic stenosis is present.  6. The inferior vena cava is normal in size with greater than 50% respiratory variability, suggesting right atrial pressure of 3 mmHg    ASSESSMENT AND PLAN  62 y.o. year old male with atrial fibrillation on Eliquis, hyperlipidemia, diabetes mellitus who is presenting after admission to the hospital for left arm weakness, found to have a right cortical/subcortical acute stroke, status post TNK.  Patient stroke etiology likely cardioembolic due to new onset atrial fibrillation.  He is compliant with his Eliquis 5 mg twice daily.  He completed occupational therapy and back to his normal self, actually back to work.  From a stroke perspective, he more than 6 weeks out, and is optimal for cardioversion.  He does have an appointment with cardiology on December 31.  Plan for now is for patient to continue his current medications, follow-up with cardiology, and return as needed as he does not have any deficit from the stroke.  He voiced understanding.   1. Cerebrovascular accident (CVA) due to embolism of right middle cerebral artery [facility])      Patient Instructions  Continue current medications Continue follow-up cardiology, he can undergo cardioversion since he is more than 6 weeks out from his stroke Continue follow-up PCP Return as needed.  No orders of the  defined types were placed in this encounter.   No orders of the defined types were placed in this encounter.   Return if symptoms worsen or fail to improve.    Windell Norfolk, MD 06/07/2023, 9:40 AM  [facility] [address] [place], [place] [address] [phone]

## 2023-06-07 NOTE — Patient Instructions (Addendum)
Continue current medications Continue follow-up cardiology, he can undergo cardioversion since he is more than 6 weeks out from his stroke Continue follow-up PCP Return as needed.

## 2023-06-20 ENCOUNTER — Encounter: Payer: Self-pay | Admitting: Cardiovascular Disease

## 2023-06-25 ENCOUNTER — Ambulatory Visit: Payer: 59 | Admitting: Physician Assistant

## 2023-07-11 ENCOUNTER — Other Ambulatory Visit: Payer: Self-pay

## 2023-07-11 NOTE — Patient Outreach (Signed)
Telephone outreach to patient to obtain mRS was successfully completed. MRS= 0   [facility] Care Management Assistant  Direct Dial: [phone]  Fax: [phone] Website: [email or web address]

## 2023-07-12 ENCOUNTER — Ambulatory Visit: Payer: 59 | Admitting: Physician Assistant

## 2023-07-16 ENCOUNTER — Other Ambulatory Visit (HOSPITAL_COMMUNITY): Payer: Self-pay | Admitting: Physician Assistant

## 2023-07-22 ENCOUNTER — Encounter: Payer: Self-pay | Admitting: Emergency Medicine

## 2023-07-22 ENCOUNTER — Ambulatory Visit: Payer: 59 | Attending: Nurse Practitioner | Admitting: Emergency Medicine

## 2023-07-22 VITALS — BP 128/82 | HR 75 | Ht 70.0 in | Wt 194.4 lb

## 2023-07-22 DIAGNOSIS — Z79899 Other long term (current) drug therapy: Secondary | ICD-10-CM | POA: Diagnosis not present

## 2023-07-22 DIAGNOSIS — R0683 Snoring: Secondary | ICD-10-CM | POA: Diagnosis not present

## 2023-07-22 DIAGNOSIS — E782 Mixed hyperlipidemia: Secondary | ICD-10-CM

## 2023-07-22 DIAGNOSIS — E119 Type 2 diabetes mellitus without complications: Secondary | ICD-10-CM | POA: Diagnosis not present

## 2023-07-22 DIAGNOSIS — I10 Essential (primary) hypertension: Secondary | ICD-10-CM

## 2023-07-22 DIAGNOSIS — I4819 Other persistent atrial fibrillation: Secondary | ICD-10-CM

## 2023-07-22 DIAGNOSIS — F1090 Alcohol use, unspecified, uncomplicated: Secondary | ICD-10-CM

## 2023-07-22 DIAGNOSIS — Z789 Other specified health status: Secondary | ICD-10-CM

## 2023-07-22 MED ORDER — METOPROLOL SUCCINATE ER 50 MG PO TB24: 50.0000 mg | ORAL_TABLET | Freq: Every day | ORAL | 3 refills | Status: AC

## 2023-07-22 MED ORDER — APIXABAN 5 MG PO TABS: 5.0000 mg | ORAL_TABLET | Freq: Two times a day (BID) | ORAL | 3 refills | Status: DC

## 2023-07-22 NOTE — Progress Notes (Signed)
Unable to reach patient about procedure, but was able to leave a detailed message. Stated that the patient needed to arrive at the hospital at 1015 , remain NPO after 0000, needs to have a ride home and a responsible adult to stay with them for 24 hours after the procedure. Instructed the patient to call back if they had any questions.

## 2023-07-22 NOTE — H&P (View-Only) (Signed)
Cardiology Office Note:    Date:  07/22/2023  ID:  Samuel Clarke, DOB 07/21/1960, MRN [id number] PCP: Caroline More, MD  [facility] Providers Cardiologist:  Reatha Harps, MD       Patient Profile:      Samuel Clarke is a 63 y.o. male with visit-pertinent history of GIST, CVA, hyperlipidemia, alcohol use, hypertension, type 2 diabetes, and atrial fibrillation.  On 04/01/2023 he presented to the ED with acute weakness and tingling to his left arm.  His CTF was inconclusive but neuroexam was positive so he received TNK in the ED.  CTA head and neck showing no LVO.  MRI showed a small 1.5 cm acute cortical and subcortical white matter infarct at superior frontal gyrus with petechial hemorrhage but no malignant hemorrhagic transformation.  He was found to be in rapid atrial fibrillation.  He did report irregular heartbeat sensation all the way back to 2018.  There was a strong suspicion for prior paroxysmal episodes based on description of symptoms of palpitations and disproportionate fatigue in the past.  TTE performed on 04/01/2023 showing LVEF 55-60%, LV function normal, no RWMA, mild LVH, RV SF normal, normal PASP, left atrial size severely dilated, trivial mitral valve regurgitation.  Toprol-XL 50 mg and Eliquis 5 mg twice daily started.  Admission LDL was 152, Crestor 20 mg added.  He was last seen in clinic on 05/06/2023.  He was doing well at the time and remained in rate controlled atrial fibrillation but remained asymptomatic.  He was interested in DCCV at the time however with recent CVA needed neurology clearance prior to procedure.  He had noted he had not used alcohol since his CVA.  Sleep study was ordered for suspected OSA but has yet to be completed.  LDL under better control at 98.  No medication changes were made.  He was to follow-up in 6 weeks for consideration of DCCV after outpatient neurology consultation.  Seen by neurology on 06/07/2023 with noted his recent  stroke likely cardioembolic due to new onset atrial fibrillation.  He was cleared for cardioversion from a neurology perspective.        History of Present Illness:  Discussed the use of AI scribe software for clinical note transcription with the patient, who gave verbal consent to proceed.  Samuel Clarke is a 63 y.o. male who returns for 6-week follow-up for atrial fibrillation.   He presents today with his wife.  He is without any cardiovascular concerns or complaints today.  He cleans windows for living and has not been as active in the winter months.  He is asymptomatic and unaware of his arrhythmia.  He mentions an one episode of heart racing due to stress, but otherwise has not noticed any significant changes in heart rate.  He has not yet completed a sleep study due to internet issues, he does have the sleep study and his possession and his Internet is now working and he plans to have this done this week.  He has not missed any doses of his Eliquis.  He is without any exertional angina, SOB, DOE, PND, lightheadedness, dizziness, leg swelling.    Review of Systems  Constitutional: Negative for weight gain and weight loss.  Cardiovascular:  Negative for chest pain, claudication, dyspnea on exertion, irregular heartbeat, leg swelling, near-syncope, orthopnea, paroxysmal nocturnal dyspnea and syncope.  Respiratory:  Negative for cough, hemoptysis and shortness of breath.   Gastrointestinal:  Negative for abdominal pain, hematochezia and melena.  Genitourinary:  Negative for hematuria.  Neurological:  Negative for dizziness and light-headedness.     See HPI     Home Medications:    Prior to Admission medications   Medication Sig Start Date End Date Taking? Authorizing Provider  apixaban (ELIQUIS) 5 MG TABS tablet Take 1 tablet (5 mg total) by mouth 2 (two) times daily. 04/23/23   Fenton, Clint R, PA  metoprolol succinate (TOPROL-XL) 50 MG 24 hr tablet TAKE 1 TABLET BY MOUTH DAILY. TAKE  WITH OR IMMEDIATELY FOLLOWING A MEAL. 07/16/23   Fenton, Clint R, PA  rosuvastatin (CRESTOR) 20 MG tablet TAKE 1 TABLET BY MOUTH EVERY DAY 07/17/23   O'Neal, Ronnald Ramp, MD   Studies Reviewed:       Echocardiogram 04/01/2023  1. Left ventricular ejection fraction, by estimation, is 55 to 60%. The  left ventricle has normal function. The left ventricle has no regional  wall motion abnormalities. There is mild concentric left ventricular  hypertrophy. Left ventricular diastolic  function could not be evaluated.   2. Right ventricular systolic function is normal. The right ventricular  size is normal. There is normal pulmonary artery systolic pressure.   3. Left atrial size was severely dilated.   4. The mitral valve is normal in structure. Trivial mitral valve  regurgitation. No evidence of mitral stenosis.   5. The aortic valve is normal in structure. Aortic valve regurgitation is  not visualized. No aortic stenosis is present.   6. The inferior vena cava is normal in size with greater than 50%  respiratory variability, suggesting right atrial pressure of 3 mmHg.   Risk Assessment/Calculations:    CHA2DS2-VASc Score = 4   This indicates a 4.8% annual risk of stroke. The patient's score is based upon: CHF History: 0 HTN History: 1 Diabetes History: 1 Stroke History: 2 Vascular Disease History: 0 Age Score: 0 Gender Score: 0        STOP-Bang Score:  5      Physical Exam:   VS:  BP 128/82 (BP Location: Left Arm, Patient Position: Sitting, Cuff Size: Normal)   Pulse 75   Ht 5\' 10"  (1.778 m)   Wt 194 lb 6.4 oz (88.2 kg)   SpO2 99%   BMI 27.89 kg/m    Wt Readings from Last 3 Encounters:  07/22/23 194 lb 6.4 oz (88.2 kg)  06/07/23 200 lb (90.7 kg)  05/06/23 206 lb (93.4 kg)    Constitutional:      Appearance: Normal and healthy appearance. Not in distress.  Neck:     Vascular: JVD normal.  Pulmonary:     Effort: Pulmonary effort is normal.     Breath sounds: Normal  breath sounds.  Chest:     Chest wall: Not tender to palpatation.  Cardiovascular:     PMI at left midclavicular line. Normal rate. Irregularly irregular rhythm. Normal S1. Normal S2.      Murmurs: There is no murmur.     No gallop.  No click. No rub.  Pulses:    Intact distal pulses.  Edema:    Peripheral edema absent.  Musculoskeletal: Normal range of motion.     Cervical back: Normal range of motion and neck supple. Skin:    General: Skin is warm and dry.  Neurological:     General: No focal deficit present.     Mental Status: Alert, oriented to person, place, and time and oriented to person, place and time.  Psychiatric:  Mood and Affect: Mood and affect normal.        Behavior: Behavior is cooperative.        Thought Content: Thought content normal.        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 He noted several years of palpitations starting back in 2018.  EKG today showing ongoing rate controlled Afib He has no cardiac awareness of his arrhythmia, currently asymptomatic -Plan for DCCV scheduled for tomorrow 07/23/2023 at 12pm with Dr. Duke Salvia -BMET & CBC today -High likelihood of recurrence post DCCV given severe left atrial dilation, unknown duration of arrhythmia, and possible OSA  -Continue Eliquis 5mg  twice daily & Metoprolol-XL 50mg  daily  -CHA2DS2-VASc Score = 4, Therefore, the patient's annual risk of stroke is 4.8 %   Informed Consent   Shared Decision Making/Informed Consent The risks (stroke, cardiac arrhythmias rarely resulting in the need for a temporary or permanent pacemaker, skin irritation or burns and complications associated with conscious sedation including aspiration, arrhythmia, respiratory failure and death), benefits (restoration of normal sinus rhythm) and alternatives of a direct current cardioversion were explained in detail to Mr. Boursiquot and he agrees to proceed.    Hypertension Blood pressure today 128/82 and under excellent control -Continue Metoprolol-XL 50 mg daily  Hyperlipidemia, LDL goal <70 LDL 98 on 04/2023 -Repeat lipid panel today  -Continue Crestor 20 mg daily  Suspected OSA STOP-BANG 5 [place] home sleep study delayed due to internet issues. Discussed the potential impact of untreated sleep apnea on AFib. -Complete sleep study as soon as possible  T2DM A1c 7.1 on 03/2023, currently not on therapy -Has yet to establish with PCP, given number to [facility] to establish care  Alcohol use No alcohol use since CVA 03/2023        Informed Consent   Shared Decision Making/Informed Consent The risks (stroke, cardiac arrhythmias rarely resulting in the need for a temporary or permanent pacemaker, skin irritation or burns and complications associated with conscious sedation including aspiration, arrhythmia, respiratory failure and death), benefits (restoration of normal sinus rhythm) and alternatives of a direct current cardioversion were explained in detail to Mr. Mangual and he agrees to proceed.       Dispo:  Return in about 3 weeks (around 08/12/2023).  Signed, Denyce Robert, NP

## 2023-07-22 NOTE — Progress Notes (Signed)
Cardiology Office Note:    Date:  07/22/2023  ID:  Samuel Clarke, DOB 07/21/1960, MRN [id number] PCP: Caroline More, MD  [facility] Providers Cardiologist:  Reatha Harps, MD       Patient Profile:      Samuel Clarke is a 63 y.o. male with visit-pertinent history of GIST, CVA, hyperlipidemia, alcohol use, hypertension, type 2 diabetes, and atrial fibrillation.  On 04/01/2023 he presented to the ED with acute weakness and tingling to his left arm.  His CTF was inconclusive but neuroexam was positive so he received TNK in the ED.  CTA head and neck showing no LVO.  MRI showed a small 1.5 cm acute cortical and subcortical white matter infarct at superior frontal gyrus with petechial hemorrhage but no malignant hemorrhagic transformation.  He was found to be in rapid atrial fibrillation.  He did report irregular heartbeat sensation all the way back to 2018.  There was a strong suspicion for prior paroxysmal episodes based on description of symptoms of palpitations and disproportionate fatigue in the past.  TTE performed on 04/01/2023 showing LVEF 55-60%, LV function normal, no RWMA, mild LVH, RV SF normal, normal PASP, left atrial size severely dilated, trivial mitral valve regurgitation.  Toprol-XL 50 mg and Eliquis 5 mg twice daily started.  Admission LDL was 152, Crestor 20 mg added.  He was last seen in clinic on 05/06/2023.  He was doing well at the time and remained in rate controlled atrial fibrillation but remained asymptomatic.  He was interested in DCCV at the time however with recent CVA needed neurology clearance prior to procedure.  He had noted he had not used alcohol since his CVA.  Sleep study was ordered for suspected OSA but has yet to be completed.  LDL under better control at 98.  No medication changes were made.  He was to follow-up in 6 weeks for consideration of DCCV after outpatient neurology consultation.  Seen by neurology on 06/07/2023 with noted his recent  stroke likely cardioembolic due to new onset atrial fibrillation.  He was cleared for cardioversion from a neurology perspective.        History of Present Illness:  Discussed the use of AI scribe software for clinical note transcription with the patient, who gave verbal consent to proceed.  Samuel Clarke is a 63 y.o. male who returns for 6-week follow-up for atrial fibrillation.   He presents today with his wife.  He is without any cardiovascular concerns or complaints today.  He cleans windows for living and has not been as active in the winter months.  He is asymptomatic and unaware of his arrhythmia.  He mentions an one episode of heart racing due to stress, but otherwise has not noticed any significant changes in heart rate.  He has not yet completed a sleep study due to internet issues, he does have the sleep study and his possession and his Internet is now working and he plans to have this done this week.  He has not missed any doses of his Eliquis.  He is without any exertional angina, SOB, DOE, PND, lightheadedness, dizziness, leg swelling.    Review of Systems  Constitutional: Negative for weight gain and weight loss.  Cardiovascular:  Negative for chest pain, claudication, dyspnea on exertion, irregular heartbeat, leg swelling, near-syncope, orthopnea, paroxysmal nocturnal dyspnea and syncope.  Respiratory:  Negative for cough, hemoptysis and shortness of breath.   Gastrointestinal:  Negative for abdominal pain, hematochezia and melena.  Genitourinary:  Negative for hematuria.  Neurological:  Negative for dizziness and light-headedness.     See HPI     Home Medications:    Prior to Admission medications   Medication Sig Start Date End Date Taking? Authorizing Provider  apixaban (ELIQUIS) 5 MG TABS tablet Take 1 tablet (5 mg total) by mouth 2 (two) times daily. 04/23/23   Fenton, Clint R, PA  metoprolol succinate (TOPROL-XL) 50 MG 24 hr tablet TAKE 1 TABLET BY MOUTH DAILY. TAKE  WITH OR IMMEDIATELY FOLLOWING A MEAL. 07/16/23   Fenton, Clint R, PA  rosuvastatin (CRESTOR) 20 MG tablet TAKE 1 TABLET BY MOUTH EVERY DAY 07/17/23   O'Neal, Ronnald Ramp, MD   Studies Reviewed:       Echocardiogram 04/01/2023  1. Left ventricular ejection fraction, by estimation, is 55 to 60%. The  left ventricle has normal function. The left ventricle has no regional  wall motion abnormalities. There is mild concentric left ventricular  hypertrophy. Left ventricular diastolic  function could not be evaluated.   2. Right ventricular systolic function is normal. The right ventricular  size is normal. There is normal pulmonary artery systolic pressure.   3. Left atrial size was severely dilated.   4. The mitral valve is normal in structure. Trivial mitral valve  regurgitation. No evidence of mitral stenosis.   5. The aortic valve is normal in structure. Aortic valve regurgitation is  not visualized. No aortic stenosis is present.   6. The inferior vena cava is normal in size with greater than 50%  respiratory variability, suggesting right atrial pressure of 3 mmHg.   Risk Assessment/Calculations:    CHA2DS2-VASc Score = 4   This indicates a 4.8% annual risk of stroke. The patient's score is based upon: CHF History: 0 HTN History: 1 Diabetes History: 1 Stroke History: 2 Vascular Disease History: 0 Age Score: 0 Gender Score: 0        STOP-Bang Score:  5      Physical Exam:   VS:  BP 128/82 (BP Location: Left Arm, Patient Position: Sitting, Cuff Size: Normal)   Pulse 75   Ht 5\' 10"  (1.778 m)   Wt 194 lb 6.4 oz (88.2 kg)   SpO2 99%   BMI 27.89 kg/m    Wt Readings from Last 3 Encounters:  07/22/23 194 lb 6.4 oz (88.2 kg)  06/07/23 200 lb (90.7 kg)  05/06/23 206 lb (93.4 kg)    Constitutional:      Appearance: Normal and healthy appearance. Not in distress.  Neck:     Vascular: JVD normal.  Pulmonary:     Effort: Pulmonary effort is normal.     Breath sounds: Normal  breath sounds.  Chest:     Chest wall: Not tender to palpatation.  Cardiovascular:     PMI at left midclavicular line. Normal rate. Irregularly irregular rhythm. Normal S1. Normal S2.      Murmurs: There is no murmur.     No gallop.  No click. No rub.  Pulses:    Intact distal pulses.  Edema:    Peripheral edema absent.  Musculoskeletal: Normal range of motion.     Cervical back: Normal range of motion and neck supple. Skin:    General: Skin is warm and dry.  Neurological:     General: No focal deficit present.     Mental Status: Alert, oriented to person, place, and time and oriented to person, place and time.  Psychiatric:  Mood and Affect: Mood and affect normal.        Behavior: Behavior is cooperative.        Thought Content: Thought content normal.        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 He noted several years of palpitations starting back in 2018.  EKG today showing ongoing rate controlled Afib He has no cardiac awareness of his arrhythmia, currently asymptomatic -Plan for DCCV scheduled for tomorrow 07/23/2023 at 12pm with Dr. Duke Salvia -BMET & CBC today -High likelihood of recurrence post DCCV given severe left atrial dilation, unknown duration of arrhythmia, and possible OSA  -Continue Eliquis 5mg  twice daily & Metoprolol-XL 50mg  daily  -CHA2DS2-VASc Score = 4, Therefore, the patient's annual risk of stroke is 4.8 %   Informed Consent   Shared Decision Making/Informed Consent The risks (stroke, cardiac arrhythmias rarely resulting in the need for a temporary or permanent pacemaker, skin irritation or burns and complications associated with conscious sedation including aspiration, arrhythmia, respiratory failure and death), benefits (restoration of normal sinus rhythm) and alternatives of a direct current cardioversion were explained in detail to Mr. Boursiquot and he agrees to proceed.    Hypertension Blood pressure today 128/82 and under excellent control -Continue Metoprolol-XL 50 mg daily  Hyperlipidemia, LDL goal <70 LDL 98 on 04/2023 -Repeat lipid panel today  -Continue Crestor 20 mg daily  Suspected OSA STOP-BANG 5 [place] home sleep study delayed due to internet issues. Discussed the potential impact of untreated sleep apnea on AFib. -Complete sleep study as soon as possible  T2DM A1c 7.1 on 03/2023, currently not on therapy -Has yet to establish with PCP, given number to [facility] to establish care  Alcohol use No alcohol use since CVA 03/2023        Informed Consent   Shared Decision Making/Informed Consent The risks (stroke, cardiac arrhythmias rarely resulting in the need for a temporary or permanent pacemaker, skin irritation or burns and complications associated with conscious sedation including aspiration, arrhythmia, respiratory failure and death), benefits (restoration of normal sinus rhythm) and alternatives of a direct current cardioversion were explained in detail to Mr. Mangual and he agrees to proceed.       Dispo:  Return in about 3 weeks (around 08/12/2023).  Signed, Denyce Robert, NP

## 2023-07-22 NOTE — Patient Instructions (Signed)
Medication Instructions:  Your physician recommends that you continue on your current medications as directed. Please refer to the Current Medication list given to you today.  *If you need a refill on your cardiac medications before your next appointment, please call your pharmacy*   Lab Work: CBC, BMET, Lipid panel today   Testing/Procedures: Your physician has requested that you have a Cardioversion. During a TEE, sound waves are used to create images of your heart. It provides your doctor with information about the size and shape of your heart and how well your heart's chambers and valves are working. In this test, a transducer is attached to the end of a flexible tube that is guided down you throat and into your esophagus (the tube leading from your mouth to your stomach) to get a more detailed image of your heart. Once the TEE has determined that a blood clot is not present, the cardioversion begins. Electrical Cardioversion uses a jolt of electricity to your heart either through paddles or wired patches attached to your chest. This is a controlled, usually prescheduled, procedure. This procedure is done at the hospital and you are not awake during the procedure. You usually go home the day of the procedure. Please see the instruction sheet given to you today for more information.      Follow-Up: At [facility], you and your health needs are our priority.  As part of our continuing mission to provide you with exceptional heart care, we have created designated Provider Care Teams.  These Care Teams include your primary Cardiologist (physician) and Advanced Practice Providers (APPs -  Physician Assistants and Nurse Practitioners) who all work together to provide you with the care you need, when you need it.  We recommend signing up for the patient portal called "MyChart".  Sign up information is provided on this After Visit Summary.  MyChart is used to connect with patients for  Virtual Visits (Telemedicine).  Patients are able to view lab/test results, encounter notes, upcoming appointments, etc.  Non-urgent messages can be sent to your provider as well.   To learn more about what you can do with MyChart, go to [email or web address].    Your next appointment:    3 weeks post Cardioversion  Provider:   Bernadene Person, NP        Other Instructions     Dear Samuel Clarke  You are scheduled for a Cardioversion on Tuesday, January 28 with Dr. Thurnell Lose.  Please arrive at the [facility] (Main Entrance A) at [facility]: [address] [place], [place] [address] at 11:00 AM (This time is 1 hour(s) before your procedure to ensure your preparation).   Free valet parking service is available. You will check in at ADMITTING.   *Please Note: You will receive a call the day before your procedure to confirm the appointment time. That time may have changed from the original time based on the schedule for that day.*    DIET:  Nothing to eat or drink after midnight except a sip of water with medications (see medication instructions below)  MEDICATION INSTRUCTIONS: !!IF ANY NEW MEDICATIONS ARE STARTED AFTER TODAY, PLEASE NOTIFY YOUR PROVIDER AS SOON AS POSSIBLE!!  FYI: Medications such as Semaglutide (Ozempic, [place]), Tirzepatide (Mounjaro, Zepbound), Dulaglutide (Trulicity), etc ("GLP1 agonists") AND Canagliflozin (Invokana), Dapagliflozin (Farxiga), Empagliflozin (Jardiance), Ertugliflozin (Steglatro), Bexagliflozin [employer]) or any combination with one of these drugs such as Invokamet (Canagliflozin/Metformin), Synjardy (Empagliflozin/Metformin), etc ("SGLT2 inhibitors") must be held around the time of  a procedure. This is not a comprehensive list of all of these drugs. Please review all of your medications and talk to your provider if you take any one of these. If you are not sure, ask your provider.     Continue taking your anticoagulant (blood thinner): Apixaban  (Eliquis).  You will need to continue this after your procedure until you are told by your provider that it is safe to stop.    LABS: labs completed today  FYI:  For your safety, and to allow [place] to monitor your vital signs accurately during the surgery/procedure we request: If you have artificial nails, gel coating, SNS etc, please have those removed prior to your surgery/procedure. Not having the nail coverings /polish removed may result in cancellation or delay of your surgery/procedure.  Your support person will be asked to wait in the waiting room during your procedure.  It is OK to have someone drop you off and come back when you are ready to be discharged.  You cannot drive after the procedure and will need someone to drive you home.  Bring your insurance cards.  *Special Note: Every effort is made to have your procedure done on time. Occasionally there are emergencies that occur at the hospital that may cause delays. Please be patient if a delay does occur.

## 2023-07-22 NOTE — Anesthesia Preprocedure Evaluation (Signed)
Anesthesia Evaluation  Patient identified by MRN, date of birth, ID band Patient awake    Reviewed: Allergy & Precautions, NPO status , Patient's Chart, lab work & pertinent test results  Airway Mallampati: I       Dental  (+) Teeth Intact, Chipped   Pulmonary    Pulmonary exam normal breath sounds clear to auscultation       Cardiovascular Normal cardiovascular exam Rhythm:Regular Rate:Normal     Neuro/Psych    GI/Hepatic   Endo/Other    Renal/GU      Musculoskeletal   Abdominal   Peds  Hematology  (+) JEHOVAH'S WITNESS  Anesthesia Other Findings   Reproductive/Obstetrics                             Anesthesia Physical Anesthesia Plan  ASA: 3  Anesthesia Plan: General   Post-op Pain Management: Minimal or no pain anticipated   Induction: Intravenous  PONV Risk Score and Plan: 1 and Propofol infusion  Airway Management Planned: Natural Airway, Simple Face Mask and Mask  Additional Equipment: None  Intra-op Plan:   Post-operative Plan:   Informed Consent: I have reviewed the patients History and Physical, chart, labs and discussed the procedure including the risks, benefits and alternatives for the proposed anesthesia with the patient or authorized representative who has indicated his/her understanding and acceptance.     Dental advisory given  Plan Discussed with: CRNA and Anesthesiologist  Anesthesia Plan Comments:         Anesthesia Quick Evaluation

## 2023-07-23 ENCOUNTER — Other Ambulatory Visit: Payer: Self-pay

## 2023-07-23 ENCOUNTER — Ambulatory Visit (HOSPITAL_COMMUNITY): Payer: Self-pay | Admitting: Anesthesiology

## 2023-07-23 ENCOUNTER — Encounter (HOSPITAL_COMMUNITY)
Admission: RE | Disposition: A | Discharge status: Home / Self Care | Payer: Self-pay | Source: Home / Self Care | Attending: Cardiovascular Disease

## 2023-07-23 ENCOUNTER — Ambulatory Visit (HOSPITAL_COMMUNITY)
Admission: RE | Admit: 2023-07-23 | Discharge: 2023-07-23 | Disposition: A | Discharge status: Home / Self Care | Payer: 59 | Attending: Cardiovascular Disease | Admitting: Cardiovascular Disease

## 2023-07-23 ENCOUNTER — Ambulatory Visit (HOSPITAL_BASED_OUTPATIENT_CLINIC_OR_DEPARTMENT_OTHER): Payer: Self-pay | Admitting: Anesthesiology

## 2023-07-23 ENCOUNTER — Encounter (HOSPITAL_COMMUNITY): Payer: Self-pay | Admitting: Cardiovascular Disease

## 2023-07-23 DIAGNOSIS — I10 Essential (primary) hypertension: Secondary | ICD-10-CM | POA: Insufficient documentation

## 2023-07-23 DIAGNOSIS — Z8673 Personal history of transient ischemic attack (TIA), and cerebral infarction without residual deficits: Secondary | ICD-10-CM | POA: Insufficient documentation

## 2023-07-23 DIAGNOSIS — I4891 Unspecified atrial fibrillation: Secondary | ICD-10-CM | POA: Diagnosis not present

## 2023-07-23 DIAGNOSIS — Z7901 Long term (current) use of anticoagulants: Secondary | ICD-10-CM | POA: Insufficient documentation

## 2023-07-23 DIAGNOSIS — Z79899 Other long term (current) drug therapy: Secondary | ICD-10-CM | POA: Diagnosis not present

## 2023-07-23 DIAGNOSIS — E785 Hyperlipidemia, unspecified: Secondary | ICD-10-CM

## 2023-07-23 DIAGNOSIS — I4819 Other persistent atrial fibrillation: Secondary | ICD-10-CM | POA: Diagnosis not present

## 2023-07-23 DIAGNOSIS — E119 Type 2 diabetes mellitus without complications: Secondary | ICD-10-CM | POA: Insufficient documentation

## 2023-07-23 HISTORY — PX: CARDIOVERSION: EP1203

## 2023-07-23 LAB — BASIC METABOLIC PANEL
BUN/Creatinine Ratio: 11 (ref 10–24)
BUN: 8 mg/dL (ref 8–27)
CO2: 23 mmol/L (ref 20–29)
Calcium: 9.9 mg/dL (ref 8.6–10.2)
Chloride: 101 mmol/L (ref 96–106)
Creatinine, Ser: 0.74 mg/dL — ABNORMAL LOW (ref 0.76–1.27)
Glucose: 78 mg/dL (ref 70–99)
Potassium: 5.5 mmol/L — ABNORMAL HIGH (ref 3.5–5.2)
Sodium: 141 mmol/L (ref 134–144)
eGFR: 102 mL/min/{1.73_m2} (ref >59)

## 2023-07-23 LAB — CBC
Hematocrit: 41 % (ref 37.5–51.0)
Hemoglobin: 13.4 g/dL (ref 13.0–17.7)
MCH: 28.9 pg (ref 26.6–33.0)
MCHC: 32.7 g/dL (ref 31.5–35.7)
MCV: 88 fL (ref 79–97)
Platelets: 272 10*3/uL (ref 150–450)
RBC: 4.64 x10E6/uL (ref 4.14–5.80)
RDW: 13.9 % (ref 11.6–15.4)
WBC: 6 10*3/uL (ref 3.4–10.8)

## 2023-07-23 LAB — GLUCOSE, CAPILLARY: Glucose-Capillary: 80 mg/dL (ref 70–99)

## 2023-07-23 LAB — LIPID PANEL
Chol/HDL Ratio: 3.9 {ratio} (ref 0.0–5.0)
Cholesterol, Total: 213 mg/dL — ABNORMAL HIGH (ref 100–199)
HDL: 55 mg/dL (ref >39)
LDL Chol Calc (NIH): 145 mg/dL — ABNORMAL HIGH (ref 0–99)
Triglycerides: 72 mg/dL (ref 0–149)
VLDL Cholesterol Cal: 13 mg/dL (ref 5–40)

## 2023-07-23 SURGERY — CARDIOVERSION (CATH LAB)
Anesthesia: General

## 2023-07-23 MED ORDER — PROPOFOL 10 MG/ML IV BOLUS
INTRAVENOUS | Status: DC | PRN
  Administered 2023-07-23: 70 mg via INTRAVENOUS
  Administered 2023-07-23: 30 mg via INTRAVENOUS

## 2023-07-23 MED ORDER — LIDOCAINE 2% (20 MG/ML) 5 ML SYRINGE
INTRAMUSCULAR | Status: DC | PRN
  Administered 2023-07-23: 80 mg via INTRAVENOUS

## 2023-07-23 SURGICAL SUPPLY — 1 items: PAD DEFIB RADIO PHYSIO CONN (PAD) ×1 IMPLANT

## 2023-07-23 NOTE — Discharge Instructions (Signed)

## 2023-07-23 NOTE — CV Procedure (Signed)
Electrical Cardioversion Procedure Note Jeffry Vogelsang [id number] [birth year]  Procedure: Electrical Cardioversion Indications:  Atrial Fibrillation  Procedure Details Consent: Risks of procedure as well as the alternatives and risks of each were explained to the (patient/caregiver).  Consent for procedure obtained. Time Out: Verified patient identification, verified procedure, site/side was marked, verified correct patient position, special equipment/implants available, medications/allergies/relevent history reviewed, required imaging and test results available.  Performed  Patient placed on cardiac monitor, pulse oximetry, supplemental oxygen as necessary.  Sedation given:  propofol Pacer pads placed anterior and posterior chest.  Cardioverted 1 time(s).  Cardioverted at 300J.  Evaluation Findings: Post procedure EKG shows: NSR Complications: None Patient did tolerate procedure well.   Chilton Si, MD 07/23/2023, 11:51 AM

## 2023-07-23 NOTE — Anesthesia Postprocedure Evaluation (Signed)
Anesthesia Post Note  Patient: Samuel Clarke  Procedure(s) Performed: CARDIOVERSION     Patient location during evaluation: PACU Anesthesia Type: General Level of consciousness: awake and alert Pain management: pain level controlled Vital Signs Assessment: post-procedure vital signs reviewed and stable Respiratory status: spontaneous breathing, nonlabored ventilation, respiratory function stable and patient connected to nasal cannula oxygen Cardiovascular status: blood pressure returned to baseline and stable Postop Assessment: no apparent nausea or vomiting Anesthetic complications: no   No notable events documented.  Last Vitals:  Vitals:   07/23/23 1015 07/23/23 1154  BP: (!) 133/94   Pulse: 91   Resp: 19   Temp: 36.7 C 36.8 C  SpO2: 98%     Last Pain:  Vitals:   07/23/23 1154  TempSrc: Temporal  PainSc: 0-No pain                 Emmelia Holdsworth

## 2023-07-23 NOTE — Transfer of Care (Signed)
Immediate Anesthesia Transfer of Care Note  Patient: Samuel Clarke  Procedure(s) Performed: CARDIOVERSION  Patient Location: PACU and Cath Lab  Anesthesia Type:MAC  Level of Consciousness: drowsy  Airway & Oxygen Therapy: Patient Spontanous Breathing and Patient connected to nasal cannula oxygen  Post-op Assessment:  VSS  Post vital signs: Reviewed and stable  Last Vitals:  Vitals Value Taken Time  BP    Temp    Pulse 81 07/23/23 1137  Resp 17 07/23/23 1137  SpO2 100 % 07/23/23 1137  Vitals shown include unfiled device data.  Last Pain:  Vitals:   07/23/23 1015  TempSrc: Temporal  PainSc: 0-No pain         Complications: No notable events documented.

## 2023-07-23 NOTE — Interval H&P Note (Signed)
History and Physical Interval Note:  07/23/2023 10:24 AM  Samuel Clarke  has presented today for surgery, with the diagnosis of afib.  The various methods of treatment have been discussed with the patient and family. After consideration of risks, benefits and other options for treatment, the patient has consented to  Procedure(s): CARDIOVERSION (N/A) as a surgical intervention.  The patient's history has been reviewed, patient examined, no change in status, stable for surgery.  I have reviewed the patient's chart and labs.  Questions were answered to the patient's satisfaction.     Chilton Si, MD

## 2023-07-24 ENCOUNTER — Telehealth: Payer: Self-pay

## 2023-07-24 DIAGNOSIS — I10 Essential (primary) hypertension: Secondary | ICD-10-CM

## 2023-07-24 DIAGNOSIS — E875 Hyperkalemia: Secondary | ICD-10-CM

## 2023-07-24 NOTE — Telephone Encounter (Signed)
Lmom to discuss lab results and recommendations. Waiting on a return call.

## 2023-08-09 ENCOUNTER — Encounter (INDEPENDENT_AMBULATORY_CARE_PROVIDER_SITE_OTHER): Payer: Self-pay | Admitting: Cardiology

## 2023-08-09 ENCOUNTER — Other Ambulatory Visit: Payer: Self-pay

## 2023-08-09 ENCOUNTER — Telehealth: Payer: Self-pay

## 2023-08-09 DIAGNOSIS — R0683 Snoring: Secondary | ICD-10-CM | POA: Diagnosis not present

## 2023-08-09 DIAGNOSIS — I480 Paroxysmal atrial fibrillation: Secondary | ICD-10-CM

## 2023-08-09 DIAGNOSIS — I10 Essential (primary) hypertension: Secondary | ICD-10-CM

## 2023-08-09 DIAGNOSIS — E875 Hyperkalemia: Secondary | ICD-10-CM

## 2023-08-09 NOTE — Telephone Encounter (Signed)
Patient called in and stated his Samuel Clarke was not registered when he attempted to complete test. Device is now registered in Samuel Clarke and patient will attempt to complete Home Sleep Test this weekend.

## 2023-08-09 NOTE — Addendum Note (Signed)
Addended by: Lamar Benes on: 08/09/2023 11:25 AM   Modules accepted: Orders

## 2023-08-15 ENCOUNTER — Ambulatory Visit: Payer: 59 | Attending: Emergency Medicine

## 2023-08-15 NOTE — Procedures (Signed)
   SLEEP STUDY REPORT Patient Information Study Date: 08/09/2023 Patient Name: Samuel Clarke Patient ID: [id number] Birth Date: [birth year] Age: 63 Gender: Male BMI: 27.8 (W=194 lb, H=5' 10'') Referring Physician: Bernadene Person, NP  TEST DESCRIPTION: Home sleep apnea testing was completed using the WatchPat, a Type 1 device, utilizing peripheral arterial tonometry (PAT), chest movement, actigraphy, pulse oximetry, pulse rate, body position and snore. AHI was calculated with apnea and hypopnea using valid sleep time as the denominator. RDI includes apneas, hypopneas, and RERAs. The data acquired and the scoring of sleep and all associated events were performed in accordance with the recommended standards and specifications as outlined in the AASM Manual for the Scoring of Sleep and Associated Events 2.2.0 (2015).  FINDINGS: 1. No evidence of Obstructive Sleep Apnea with AHI 1/hr. 2. No Central Sleep Apnea. 3. Oxygen desaturations as low as 88%. 4. Moderate snoring was present. O2 sats were < 88% for 0 minutes. 5. Total sleep time was 8 hrs and 24 min. 6. 17.4% of total sleep time was spent in REM sleep. 7. Normal sleep onset latency at 14 min. 8. Prolonged REM sleep onset latency at 214 min. 9. Total awakenings were 6. 10. Arrhythmia Detection: Possible atrial fibrillation lasting 4hr 21 min and 49sec. This is not diagnostic and recommend further evaluation if clinically indicated.  DIAGNOSIS: Normal study with no significant sleep disordered breathing. Possible Atrial Fibrillation  RECOMMENDATIONS: 1. Normal study with no significant sleep disordered breathing.  2. Healthy sleep recommendations include: adequate nightly sleep (normal 7-9 hrs/night), avoidance of caffeine after noon and alcohol near bedtime, and maintaining a sleep environment that is cool, dark and quiet.  3. Weight loss for overweight patients is recommended.  4. Snoring recommendations include: weight loss  where appropriate, side sleeping, and avoidance of alcohol before bed.  5. Operation of motor vehicle or dangerous equipment must be avoided when feeling drowsy, excessively sleepy, or mentally fatigued.  6. An ENT consultation which may be useful for specific causes of and possible treatment of bothersome snoring .  7. Weight loss may be of benefit in reducing the severity of snoring.   Signature: Armanda Magic, MD; [facility]; Diplomat, American Board of Sleep Medicine Electronically Signed: 08/15/2023 11:55:25 AM

## 2023-08-16 ENCOUNTER — Encounter: Payer: Self-pay | Admitting: Nurse Practitioner

## 2023-08-16 ENCOUNTER — Ambulatory Visit: Payer: 59 | Attending: Nurse Practitioner | Admitting: Nurse Practitioner

## 2023-08-16 VITALS — BP 120/60 | HR 83 | Ht 70.0 in | Wt 190.0 lb

## 2023-08-16 DIAGNOSIS — I4819 Other persistent atrial fibrillation: Secondary | ICD-10-CM | POA: Diagnosis not present

## 2023-08-16 DIAGNOSIS — E119 Type 2 diabetes mellitus without complications: Secondary | ICD-10-CM

## 2023-08-16 DIAGNOSIS — E785 Hyperlipidemia, unspecified: Secondary | ICD-10-CM

## 2023-08-16 DIAGNOSIS — I10 Essential (primary) hypertension: Secondary | ICD-10-CM | POA: Diagnosis not present

## 2023-08-16 DIAGNOSIS — Z8673 Personal history of transient ischemic attack (TIA), and cerebral infarction without residual deficits: Secondary | ICD-10-CM

## 2023-08-16 LAB — COMPREHENSIVE METABOLIC PANEL
ALT: 17 [IU]/L (ref 0–44)
AST: 21 [IU]/L (ref 0–40)
Albumin: 4.5 g/dL (ref 3.9–4.9)
Alkaline Phosphatase: 70 [IU]/L (ref 44–121)
BUN/Creatinine Ratio: 12 (ref 10–24)
BUN: 8 mg/dL (ref 8–27)
Bilirubin Total: 0.3 mg/dL (ref 0.0–1.2)
CO2: 25 mmol/L (ref 20–29)
Calcium: 10 mg/dL (ref 8.6–10.2)
Chloride: 104 mmol/L (ref 96–106)
Creatinine, Ser: 0.69 mg/dL — ABNORMAL LOW (ref 0.76–1.27)
Globulin, Total: 2.4 g/dL (ref 1.5–4.5)
Glucose: 94 mg/dL (ref 70–99)
Potassium: 5.7 mmol/L — ABNORMAL HIGH (ref 3.5–5.2)
Sodium: 144 mmol/L (ref 134–144)
Total Protein: 6.9 g/dL (ref 6.0–8.5)
eGFR: 105 mL/min/{1.73_m2} (ref >59)

## 2023-08-16 NOTE — Patient Instructions (Addendum)
 Medication Instructions:  Your physician recommends that you continue on your current medications as directed. Please refer to the Current Medication list given to you today.  *If you need a refill on your cardiac medications before your next appointment, please call your pharmacy*   Lab Work: CMET today. Please complete fasting lipid panel at your convenience.    Testing/Procedures: NONE ordered at this time of appointment   Follow-Up: At [facility], you and your health needs are our priority.  As part of our continuing mission to provide you with exceptional heart care, we have created designated Provider Care Teams.  These Care Teams include your primary Cardiologist (physician) and Advanced Practice Providers (APPs -  Physician Assistants and Nurse Practitioners) who all work together to provide you with the care you need, when you need it.  We recommend signing up for the patient portal called "MyChart".  Sign up information is provided on this After Visit Summary.  MyChart is used to connect with patients for Virtual Visits (Telemedicine).  Patients are able to view lab/test results, encounter notes, upcoming appointments, etc.  Non-urgent messages can be sent to your provider as well.   To learn more about what you can do with MyChart, go to [email or web address].    Your next appointment:   3-4 month(s) 2-3 weeks (Afib clinic) Provider:   Reatha Harps, MD     Other Instructions Mayford Knife discussed at appointment

## 2023-08-16 NOTE — Progress Notes (Signed)
 Office Visit    Patient Name: Samuel Clarke Date of Encounter: 08/16/2023  Primary Care Provider:  Caroline More, MD Primary Cardiologist:  Reatha Harps, MD  Chief Complaint    63 year old male with a history of persistent atrial fibrillation, hypertension, hyperlipidemia, CVA, gastrointestinal stromal tumor, type 2 diabetes, and prior alcohol abuse who presents for follow-up related to atrial fibrillation s/p DCCV.  Past Medical History    Past Medical History:  Diagnosis Date   Alcohol use    Gastric mass    /notes 02/04/2017   Heart palpitations 06/2016   Normocytic anemia 01/2017   Obesity 06/2016   Patient is Jehovah's Witness    Pleurisy ~ 2008   Refusal of blood transfusions as patient is Jehovah's Witness    Rosacea    Past Surgical History:  Procedure Laterality Date   ABDOMINAL SURGERY  01/2017   per pt: stomach resection (25%), spleen removed, lymph nodes removed due to GI tumor   CARDIOVERSION N/A 07/23/2023   Procedure: CARDIOVERSION;  Surgeon: Chilton Si, MD;  Location: [facility] INVASIVE CV LAB;  Service: Cardiovascular;  Laterality: N/A;   ESOPHAGOGASTRODUODENOSCOPY     ESOPHAGOGASTRODUODENOSCOPY N/A 02/04/2017   Procedure: ESOPHAGOGASTRODUODENOSCOPY (EGD);  Surgeon: Meryl Dare, MD;  Location: [facility] ENDOSCOPY;  Service: Endoscopy;  Laterality: N/A;   TONSILLECTOMY     At age 70 or 7    Allergies  No Known Allergies   Labs/Other Studies Reviewed    The following studies were reviewed today:  Cardiac Studies & Procedures   ______________________________________________________________________________________________     ECHOCARDIOGRAM  ECHOCARDIOGRAM COMPLETE 04/01/2023  Narrative ECHOCARDIOGRAM REPORT    Patient Name:   Samuel Clarke Date of Exam: 04/01/2023 Medical Rec #:  [id number]       Height:       70.0 in Accession #:    [id number]      Weight:       218.3 lb Date of Birth:  02-02-1961       BSA:          2.166  m Patient Age:    62 years        BP:           120/70 mmHg Patient Gender: M               HR:           103 bpm. Exam Location:  Inpatient  Procedure: 2D Echo, Cardiac Doppler and Color Doppler  Indications:    stroke  History:        Patient has no prior history of Echocardiogram examinations. Arrythmias:Atrial Fibrillation.  Sonographer:    Delcie Roch RDCS Referring Phys: [id number] ASHISH ARORA  IMPRESSIONS   1. Left ventricular ejection fraction, by estimation, is 55 to 60%. The left ventricle has normal function. The left ventricle has no regional wall motion abnormalities. There is mild concentric left ventricular hypertrophy. Left ventricular diastolic function could not be evaluated. 2. Right ventricular systolic function is normal. The right ventricular size is normal. There is normal pulmonary artery systolic pressure. 3. Left atrial size was severely dilated. 4. The mitral valve is normal in structure. Trivial mitral valve regurgitation. No evidence of mitral stenosis. 5. The aortic valve is normal in structure. Aortic valve regurgitation is not visualized. No aortic stenosis is present. 6. The inferior vena cava is normal in size with greater than 50% respiratory variability, suggesting right atrial pressure of 3 mmHg.  FINDINGS Left Ventricle: Left  ventricular ejection fraction, by estimation, is 55 to 60%. The left ventricle has normal function. The left ventricle has no regional wall motion abnormalities. The left ventricular internal cavity size was normal in size. There is mild concentric left ventricular hypertrophy. Left ventricular diastolic function could not be evaluated due to atrial fibrillation. Left ventricular diastolic function could not be evaluated.  Right Ventricle: The right ventricular size is normal. No increase in right ventricular wall thickness. Right ventricular systolic function is normal. There is normal pulmonary artery systolic pressure.  The tricuspid regurgitant velocity is 1.23 m/s, and with an assumed right atrial pressure of 3 mmHg, the estimated right ventricular systolic pressure is 9.1 mmHg.  Left Atrium: Left atrial size was severely dilated.  Right Atrium: Right atrial size was normal in size.  Pericardium: There is no evidence of pericardial effusion.  Mitral Valve: The mitral valve is normal in structure. There is mild calcification of the mitral valve leaflet(s). Mild to moderate mitral annular calcification. Trivial mitral valve regurgitation. No evidence of mitral valve stenosis.  Tricuspid Valve: The tricuspid valve is normal in structure. Tricuspid valve regurgitation is trivial. No evidence of tricuspid stenosis.  Aortic Valve: The aortic valve is normal in structure. Aortic valve regurgitation is not visualized. No aortic stenosis is present.  Pulmonic Valve: The pulmonic valve was normal in structure. Pulmonic valve regurgitation is not visualized. No evidence of pulmonic stenosis.  Aorta: The aortic root is normal in size and structure.  Venous: The inferior vena cava is normal in size with greater than 50% respiratory variability, suggesting right atrial pressure of 3 mmHg.  IAS/Shunts: No atrial level shunt detected by color flow Doppler.   LEFT VENTRICLE PLAX 2D LVIDd:         4.20 cm LVIDs:         3.00 cm LV PW:         1.20 cm LV IVS:        1.20 cm LVOT diam:     2.10 cm LV SV:         54 LV SV Index:   25 LVOT Area:     3.46 cm   RIGHT VENTRICLE             IVC RV Basal diam:  3.00 cm     IVC diam: 1.00 cm RV S prime:     10.60 cm/s TAPSE (M-mode): 1.2 cm  LEFT ATRIUM              Index        RIGHT ATRIUM           Index LA diam:        4.60 cm  2.12 cm/m   RA Area:     16.70 cm LA Vol (A2C):   103.0 ml 47.55 ml/m  RA Volume:   43.10 ml  19.90 ml/m LA Vol (A4C):   97.5 ml  45.01 ml/m LA Biplane Vol: 102.0 ml 47.09 ml/m AORTIC VALVE LVOT Vmax:   97.40 cm/s LVOT Vmean:   72.100 cm/s LVOT VTI:    0.156 m  AORTA Ao Root diam: 3.20 cm Ao Asc diam:  3.90 cm  TRICUSPID VALVE TR Peak grad:   6.1 mmHg TR Vmax:        123.00 cm/s  SHUNTS Systemic VTI:  0.16 m Systemic Diam: 2.10 cm  Chilton Si MD Electronically signed by Chilton Si MD Signature Date/Time: 04/01/2023/4:54:23 PM    Final  ______________________________________________________________________________________________     Recent Labs: 04/01/2023: ALT 36 07/22/2023: BUN 8; Creatinine, Ser 0.74; Hemoglobin 13.4; Platelets 272; Potassium 5.5; Sodium 141  Recent Lipid Panel    Component Value Date/Time   CHOL 213 (H) 07/22/2023 1112   TRIG 72 07/22/2023 1112   HDL 55 07/22/2023 1112   CHOLHDL 3.9 07/22/2023 1112   CHOLHDL 3.7 04/02/2023 0233   VLDL 20 04/02/2023 0233   LDLCALC 145 (H) 07/22/2023 1112   LDLDIRECT 98 05/06/2023 0922    History of Present Illness    62 year old male with the above past medical history including persistent atrial fibrillation, hypertension, hyperlipidemia, CVA, gastrointestinal stromal tumor, type 2 diabetes, and prior alcohol abuse.   He was hospitalized in October 2024 in the setting of acute CVA.  He was found to be in rapid atrial fibrillation at the time.  Echocardiogram showed EF 55 to 60%, normal LV function, no RWMA, mild LVH, normal RV systolic function, normal PASP, severely dilated left atrium, trivial mitral valve regurgitation.  It was felt that his stroke was likely cardioembolic in the setting of atrial fibrillation.  He was last seen in the office on 07/22/2023 and was stable from a cardiac standpoint.  He remained in atrial fibrillation but was cardiac unaware.  He underwent successful DCCV with restoration of sinus rhythm on 07/23/2023.  Home sleep study was negative for OSA, but did show possible atrial fibrillation.  He presents today for follow-up.  Since his last visit he has been stable overall from a cardiac  standpoint.  EKG today shows rate controlled atrial fibrillation.  He is generally cardiac unaware.  The other night, while he was making dinner, he had a brief episode where he felt that his heart was racing.  He denies any chest pain, palpitations, dizziness, dyspnea, edema, PND, orthopnea, weight gain.  He reports adherence to his Eliquis.  Overall, he reports feeling well.  Home Medications    Current Outpatient Medications  Medication Sig Dispense Refill   apixaban (ELIQUIS) 5 MG TABS tablet Take 1 tablet (5 mg total) by mouth 2 (two) times daily. 180 tablet 3   metoprolol succinate (TOPROL-XL) 50 MG 24 hr tablet Take 1 tablet (50 mg total) by mouth daily. TAKE WITH OR IMMEDIATELY FOLLOWING A MEAL. 90 tablet 3   rosuvastatin (CRESTOR) 20 MG tablet TAKE 1 TABLET BY MOUTH EVERY DAY 90 tablet 3   No current facility-administered medications for this visit.     Review of Systems    He denies chest pain, palpitations, dyspnea, pnd, orthopnea, n, v, dizziness, syncope, edema, weight gain, or early satiety. All other systems reviewed and are otherwise negative except as noted above.   Physical Exam    VS:  BP 120/60   Pulse 83   Ht 5\' 10"  (1.778 m)   Wt 190 lb (86.2 kg)   SpO2 100%   BMI 27.26 kg/m   STOP-Bang Score:  5      GEN: Well nourished, well developed, in no acute distress. HEENT: normal. Neck: Supple, no JVD, carotid bruits, or masses. Cardiac: IRIR, no murmurs, rubs, or gallops. No clubbing, cyanosis, edema.  Radials/DP/PT 2+ and equal bilaterally.  Respiratory:  Respirations regular and unlabored, clear to auscultation bilaterally. GI: Soft, nontender, nondistended, BS + x 4. MS: no deformity or atrophy. Skin: warm and dry, no rash. Neuro:  Strength and sensation are intact. Psych: Normal affect.  Accessory Clinical Findings    ECG personally reviewed by me today - EKG Interpretation  Date/Time:  Friday August 16 2023 08:04:41 EST Ventricular Rate:  83 PR  Interval:    QRS Duration:  76 QT Interval:  382 QTC Calculation: 448 R Axis:   63  Text Interpretation: Atrial fibrillation When compared with ECG of 23-Jul-2023 11:53, Atrial fibrillation has replaced Sinus rhythm Confirmed by Bernadene Person ([id number]) on 08/16/2023 8:28:45 AM  - no acute changes.   Lab Results  Component Value Date   WBC 6.0 07/22/2023   HGB 13.4 07/22/2023   HCT 41.0 07/22/2023   MCV 88 07/22/2023   PLT 272 07/22/2023   Lab Results  Component Value Date   CREATININE 0.74 (L) 07/22/2023   BUN 8 07/22/2023   NA 141 07/22/2023   K 5.5 (H) 07/22/2023   CL 101 07/22/2023   CO2 23 07/22/2023   Lab Results  Component Value Date   ALT 36 04/01/2023   AST 37 04/01/2023   ALKPHOS 62 04/01/2023   BILITOT 0.4 04/01/2023   Lab Results  Component Value Date   CHOL 213 (H) 07/22/2023   HDL 55 07/22/2023   LDLCALC 145 (H) 07/22/2023   LDLDIRECT 98 05/06/2023   TRIG 72 07/22/2023   CHOLHDL 3.9 07/22/2023    Lab Results  Component Value Date   HGBA1C 7.1 (H) 04/01/2023    Assessment & Plan    1. Persistent atrial fibrillation: Echocardiogram in 03/2023 showed EF 55 to 60%, normal LV function, no RWMA, mild LVH, normal RV systolic function, normal PASP, severely dilated left atrium, trivial mitral valve regurgitation.  Recent sleep study was negative for OSA.  S/p DCCV on 07/23/2023 with early recurrence of atrial fibrillation. EKG today shows rate controlled atrial fibrillation.  He is generally asymptomatic.  At this point, the decision should be made to pursue either rate control or rhythm Recommend follow-up with A-fib clinic to discuss options for ongoing management.  Reviewed ED precautions. Continue metoprolol, Eliquis.   2. Hypertension: BP well controlled. Continue current antihypertensive regimen.   3. Hyperlipidemia: LDL was 145 on 07/22/2023.  Recently started taking Crestor consistently.  Will repeat fasting lipids, will check CMET given recently elevated  potassium (5.5 on most recent labs).  4. History of CVA: Likely cardioembolic. Continue Eliquis.  5. Type 2 diabetes: A1c was 7.1 in 03/2023. Monitor managed per PCP.  6. Disposition: Follow-up in A-fib clinic in the next month, follow-up in 3 to 4 months with Dr. Flora Lipps.       Joylene Grapes, NP 08/16/2023, 8:28 AM

## 2023-08-19 ENCOUNTER — Other Ambulatory Visit: Payer: Self-pay

## 2023-08-19 ENCOUNTER — Telehealth: Payer: Self-pay

## 2023-08-19 DIAGNOSIS — E875 Hyperkalemia: Secondary | ICD-10-CM

## 2023-08-19 DIAGNOSIS — I10 Essential (primary) hypertension: Secondary | ICD-10-CM | POA: Diagnosis not present

## 2023-08-19 NOTE — Addendum Note (Signed)
 Addended by: Lamar Benes on: 08/19/2023 11:19 AM   Modules accepted: Orders

## 2023-08-19 NOTE — Telephone Encounter (Signed)
 Pt returned call. Pt was notified of lab results. Pt will pick up Lokelma sample and repeat labs in 2-3 days.

## 2023-08-19 NOTE — Telephone Encounter (Signed)
Lmom to discuss lab results and recommendations.  

## 2023-08-20 ENCOUNTER — Telehealth: Payer: Self-pay | Admitting: *Deleted

## 2023-08-20 LAB — LIPID PANEL
Chol/HDL Ratio: 2.4 ratio (ref 0.0–5.0)
Cholesterol, Total: 160 mg/dL (ref 100–199)
HDL: 66 mg/dL (ref >39)
LDL Chol Calc (NIH): 81 mg/dL (ref 0–99)
Triglycerides: 63 mg/dL (ref 0–149)
VLDL Cholesterol Cal: 13 mg/dL (ref 5–40)

## 2023-08-20 NOTE — Telephone Encounter (Signed)
 The patient has been notified of the result. Lmtcb showed no significant sleep apnea.

## 2023-08-20 NOTE — Telephone Encounter (Signed)
-----   Message from Armanda Magic sent at 08/15/2023 11:57 AM EST ----- Please let patient know that sleep study showed no significant sleep apnea.

## 2023-08-22 DIAGNOSIS — E875 Hyperkalemia: Secondary | ICD-10-CM | POA: Diagnosis not present

## 2023-08-23 ENCOUNTER — Telehealth: Payer: Self-pay

## 2023-08-23 ENCOUNTER — Other Ambulatory Visit: Payer: Self-pay

## 2023-08-23 DIAGNOSIS — E875 Hyperkalemia: Secondary | ICD-10-CM

## 2023-08-23 LAB — BASIC METABOLIC PANEL
BUN/Creatinine Ratio: 8 — ABNORMAL LOW (ref 10–24)
BUN: 6 mg/dL — ABNORMAL LOW (ref 8–27)
CO2: 23 mmol/L (ref 20–29)
Calcium: 10 mg/dL (ref 8.6–10.2)
Chloride: 103 mmol/L (ref 96–106)
Creatinine, Ser: 0.71 mg/dL — ABNORMAL LOW (ref 0.76–1.27)
Glucose: 89 mg/dL (ref 70–99)
Potassium: 5.3 mmol/L — ABNORMAL HIGH (ref 3.5–5.2)
Sodium: 141 mmol/L (ref 134–144)
eGFR: 104 mL/min/{1.73_m2} (ref >59)

## 2023-08-23 NOTE — Telephone Encounter (Signed)
 Lab results and recommendations sent to pt via mychart. Lab orders placed for repeat BMET in 2 weeks.

## 2023-08-23 NOTE — Telephone Encounter (Signed)
 Lmom to discuss lab results. Waiting on a return call. Results were also sent to pt via mychart.

## 2023-09-05 ENCOUNTER — Ambulatory Visit (HOSPITAL_COMMUNITY)
Admission: RE | Admit: 2023-09-05 | Discharge: 2023-09-05 | Disposition: A | Discharge status: Home / Self Care | Payer: 59 | Source: Ambulatory Visit | Attending: Physician Assistant | Admitting: Physician Assistant

## 2023-09-05 ENCOUNTER — Telehealth: Payer: Self-pay

## 2023-09-05 ENCOUNTER — Encounter (HOSPITAL_COMMUNITY): Payer: Self-pay | Admitting: Physician Assistant

## 2023-09-05 VITALS — BP 148/90 | HR 73 | Ht 70.0 in | Wt 189.4 lb

## 2023-09-05 DIAGNOSIS — Z9081 Acquired absence of spleen: Secondary | ICD-10-CM | POA: Insufficient documentation

## 2023-09-05 DIAGNOSIS — I10 Essential (primary) hypertension: Secondary | ICD-10-CM | POA: Insufficient documentation

## 2023-09-05 DIAGNOSIS — E785 Hyperlipidemia, unspecified: Secondary | ICD-10-CM | POA: Diagnosis not present

## 2023-09-05 DIAGNOSIS — E119 Type 2 diabetes mellitus without complications: Secondary | ICD-10-CM | POA: Insufficient documentation

## 2023-09-05 DIAGNOSIS — Z7901 Long term (current) use of anticoagulants: Secondary | ICD-10-CM | POA: Diagnosis not present

## 2023-09-05 DIAGNOSIS — D6869 Other thrombophilia: Secondary | ICD-10-CM | POA: Insufficient documentation

## 2023-09-05 DIAGNOSIS — I4819 Other persistent atrial fibrillation: Secondary | ICD-10-CM | POA: Diagnosis not present

## 2023-09-05 DIAGNOSIS — Z8673 Personal history of transient ischemic attack (TIA), and cerebral infarction without residual deficits: Secondary | ICD-10-CM | POA: Diagnosis not present

## 2023-09-05 NOTE — Telephone Encounter (Signed)
 Per Jorja Loa, called to arrange EP consult for ablation/Watchman.  Left message to call back.

## 2023-09-05 NOTE — Progress Notes (Signed)
 Primary Care Physician: Caroline More, MD Primary Cardiologist: Reatha Harps, MD Electrophysiologist: None  Referring Physician: [name] is a 63 y.o. male with a history of GIST, CVA, alcohol abuse, HLD, DM, HTN, atrial fibrillation who presents for follow up in the [facility].  The patient presented to the ED early on the morning of 04/01/23 with acute weakness and tingling of his left arm. He described a progression of symptoms from tingling to significant numbness/weakness. CT head inconclusive but with exam positive for acute neuro deficit, patient received TNK. MRI showed small 1.5cm acute cortical and subcortical white matter infarct at superior frontal gyrus with petechial hemorrhage but no malignant hemorrhagic transformation. He was also noted to be in rapid afib. Patient was started on Eliquis for stroke prevention and metoprolol for rate control.   Patient returns for follow up for atrial fibrillation. He underwent DCCV on 07/23/23 but was back in afib at his appointment on 08/16/23. He had a sleep study 08/09/23 which was negative for OSA. He remains in rate controlled afib and is unaware of his arrhythmia. No current bleeding issues on anticoagulation.    Atrial Fibrillation Risk Factors:  he does not have symptoms or diagnosis of sleep apnea. Negative sleep study 08/09/23. he does not have a history of rheumatic fever. he does have a history of alcohol use. The patient does not have a history of early familial atrial fibrillation or other arrhythmias.  Atrial Fibrillation Management history:  Previous antiarrhythmic drugs: none Previous cardioversions: 07/23/23 Previous ablations: none Anticoagulation history: Eliquis  ROS- All systems are reviewed and negative except as per the HPI above.  Past Medical History:  Diagnosis Date   Alcohol use    Gastric mass    /notes 02/04/2017   Heart palpitations 06/2016    Normocytic anemia 01/2017   Obesity 06/2016   Patient is Jehovah's Witness    Pleurisy ~ 2008   Refusal of blood transfusions as patient is Jehovah's Witness    Rosacea     Current Outpatient Medications  Medication Sig Dispense Refill   apixaban (ELIQUIS) 5 MG TABS tablet Take 1 tablet (5 mg total) by mouth 2 (two) times daily. 180 tablet 3   metoprolol succinate (TOPROL-XL) 50 MG 24 hr tablet Take 1 tablet (50 mg total) by mouth daily. TAKE WITH OR IMMEDIATELY FOLLOWING A MEAL. 90 tablet 3   rosuvastatin (CRESTOR) 20 MG tablet TAKE 1 TABLET BY MOUTH EVERY DAY 90 tablet 3   No current facility-administered medications for this encounter.    Physical Exam: BP (!) 148/90   Pulse 73   Ht 5\' 10"  (1.778 m)   Wt 85.9 kg   BMI 27.18 kg/m   GEN: Well nourished, well developed in no acute distress CARDIAC: Irregularly irregular rate and rhythm, no murmurs, rubs, gallops RESPIRATORY:  Clear to auscultation without rales, wheezing or rhonchi  ABDOMEN: Soft, non-tender, non-distended EXTREMITIES:  No edema; No deformity    Wt Readings from Last 3 Encounters:  09/05/23 85.9 kg  08/16/23 86.2 kg  07/23/23 88.3 kg     EKG today demonstrates  Afib Vent. rate 73 BPM PR interval * ms QRS duration 72 ms QT/QTcB 416/458 ms   Echo 04/01/23 demonstrated   1. Left ventricular ejection fraction, by estimation, is 55 to 60%. The  left ventricle has normal function. The left ventricle has no regional  wall motion abnormalities. There is mild concentric left ventricular  hypertrophy. Left ventricular diastolic function could not be evaluated.   2. Right ventricular systolic function is normal. The right ventricular  size is normal. There is normal pulmonary artery systolic pressure.   3. Left atrial size was severely dilated.   4. The mitral valve is normal in structure. Trivial mitral valve  regurgitation. No evidence of mitral stenosis.   5. The aortic valve is normal in structure.  Aortic valve regurgitation is  not visualized. No aortic stenosis is present.   6. The inferior vena cava is normal in size with greater than 50%  respiratory variability, suggesting right atrial pressure of 3 mmHg.    CHA2DS2-VASc Score = 4  The patient's score is based upon: CHF History: 0 HTN History: 1 Diabetes History: 1 Stroke History: 2 Vascular Disease History: 0 Age Score: 0 Gender Score: 0       ASSESSMENT AND PLAN: Persistent Atrial Fibrillation (ICD10:  I48.19) The patient's CHA2DS2-VASc score is 4, indicating a 4.8% annual risk of stroke.   S/p DCCV 07/23/23 with quick return of afib. We discussed rate vs rhythm control today. Would favor a rhythm control strategy given his young age. We discussed rhythm control options including AAD (flecainide, Multaq) and afib ablation. His QT is marginal for dofetilide (460-470 ms in SR). He would like to discuss afib ablation with EP, will refer. He does have a severely dilated LA on echo. If not felt to be a good candidate for ablation, he would prefer to start flecainide. Would need a stress test after flecainide initiation.  Continue Eliquis 5 mg BID Continue Toprol 50 mg daily  Secondary Hypercoagulable State (ICD10:  D68.69) The patient is at significant risk for stroke/thromboembolism based upon his CHA2DS2-VASc Score of 4.  Continue Apixaban (Eliquis). No current bleeding issues. 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 He did have issues with anemia at that time although his hgb is currently stable. 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 For these reasons, patient is interested in [place] evaluation. Will refer to Watchman team.   HTN Mildly elevated today, better controlled at previous visits. Continue to monitor for now.      Follow up with EP to discuss afib ablation and Watchman.       Jorja Loa PA-C Afib  Clinic [facility] [address] [place], [place] [address] [phone]

## 2023-09-09 NOTE — Telephone Encounter (Signed)
 Left message to call back

## 2023-09-12 DIAGNOSIS — E875 Hyperkalemia: Secondary | ICD-10-CM | POA: Diagnosis not present

## 2023-09-13 LAB — BASIC METABOLIC PANEL
BUN/Creatinine Ratio: 10 (ref 10–24)
BUN: 6 mg/dL — ABNORMAL LOW (ref 8–27)
CO2: 23 mmol/L (ref 20–29)
Calcium: 10 mg/dL (ref 8.6–10.2)
Chloride: 104 mmol/L (ref 96–106)
Creatinine, Ser: 0.63 mg/dL — ABNORMAL LOW (ref 0.76–1.27)
Glucose: 98 mg/dL (ref 70–99)
Potassium: 5.6 mmol/L — ABNORMAL HIGH (ref 3.5–5.2)
Sodium: 143 mmol/L (ref 134–144)
eGFR: 107 mL/min/{1.73_m2} (ref >59)

## 2023-09-16 NOTE — Telephone Encounter (Signed)
 Left message to call back

## 2023-09-18 ENCOUNTER — Telehealth: Payer: Self-pay

## 2023-09-18 DIAGNOSIS — E875 Hyperkalemia: Secondary | ICD-10-CM

## 2023-09-18 NOTE — Telephone Encounter (Signed)
 Lmom to discuss lab results. Waiting on a return call.

## 2023-09-18 NOTE — Addendum Note (Signed)
 Addended by: Lamar Benes on: 09/18/2023 05:32 PM   Modules accepted: Orders

## 2023-09-18 NOTE — Telephone Encounter (Signed)
 Scheduled the patient for EP consult with Dr. Lalla Brothers on 10/16/2023. He was grateful for call and agreed with plan.

## 2023-09-30 DIAGNOSIS — E875 Hyperkalemia: Secondary | ICD-10-CM | POA: Diagnosis not present

## 2023-10-01 LAB — BASIC METABOLIC PANEL WITH GFR
BUN/Creatinine Ratio: 17 (ref 10–24)
BUN: 12 mg/dL (ref 8–27)
CO2: 22 mmol/L (ref 20–29)
Calcium: 9.4 mg/dL (ref 8.6–10.2)
Chloride: 104 mmol/L (ref 96–106)
Creatinine, Ser: 0.71 mg/dL — ABNORMAL LOW (ref 0.76–1.27)
Glucose: 88 mg/dL (ref 70–99)
Potassium: 4.9 mmol/L (ref 3.5–5.2)
Sodium: 142 mmol/L (ref 134–144)
eGFR: 103 mL/min/{1.73_m2} (ref >59)

## 2023-10-16 ENCOUNTER — Ambulatory Visit: Admitting: Cardiology

## 2023-10-30 ENCOUNTER — Encounter: Payer: Self-pay | Admitting: Cardiology

## 2023-10-30 ENCOUNTER — Ambulatory Visit: Attending: Cardiology | Admitting: Cardiology

## 2023-10-30 VITALS — BP 146/80 | HR 72 | Ht 70.0 in | Wt 182.0 lb

## 2023-10-30 DIAGNOSIS — I4819 Other persistent atrial fibrillation: Secondary | ICD-10-CM

## 2023-10-30 DIAGNOSIS — I10 Essential (primary) hypertension: Secondary | ICD-10-CM | POA: Diagnosis not present

## 2023-10-30 DIAGNOSIS — Z8673 Personal history of transient ischemic attack (TIA), and cerebral infarction without residual deficits: Secondary | ICD-10-CM | POA: Diagnosis not present

## 2023-10-30 MED ORDER — FLECAINIDE ACETATE 100 MG PO TABS: 100.0000 mg | ORAL_TABLET | Freq: Two times a day (BID) | ORAL | 3 refills | Status: DC

## 2023-10-30 NOTE — H&P (View-Only) (Signed)
 Electrophysiology Office Note:    Date:  10/30/2023   ID:  Samuel Clarke, DOB [birth year], MRN [id number]  [facility] Cardiologist:  Oneil Bigness, MD  [facility] Electrophysiologist:  Boyce Byes, MD   Referring MD: German Koller, MD   Chief Complaint: Atrial fibrillation  History of Present Illness:    Samuel Clarke is a 63 year old man who I am seeing today for an evaluation of persistent atrial fibrillation at the request of Dr. Rolm Clos.  The patient has a history of persistent atrial fibrillation, hypertension, hyperlipidemia, stroke, gastrointestinal stromal tumor, diabetes and prior alcohol abuse.  He has had prior cardioversion for his atrial fibrillation.  He was hospitalized in October 2024 with an acute stroke and was found to be in atrial fibrillation.  His EF was normal.  He received a cardioversion in January when he presented for follow-up in February with [facility], he was back in atrial fibrillation.  He saw a [place] in the A-fib clinic September 05, 2023.  He had a sleep study that was negative for sleep apnea.  At the appointment with Willard Harman he was unaware of his arrhythmia but remained in atrial fibrillation.  The patient is concerned about his risk of bleeding given GI stromal tumor.  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  Today he confirms the above and tells me that he is symptomatic with decreased exercise tolerance while in atrial fibrillation.     Their past medical, social and family history was reviewed.   ROS:   Please see the history of present illness.    All other systems reviewed and are negative.  EKGs/Labs/Other Studies Reviewed:    The following studies were reviewed today:  October 2024 echo EF 55% RV normal Severely dilated left atrium Trivial MR  September 05, 2023 EKG reviewed and shows atrial fibrillation, ventricular rate 73 bpm May 06, 2023 EKG shows atrial fibrillation  February 04, 2017 EKG shows sinus rhythm.  QTc 473 ms       Physical Exam:    VS:  BP (!) 146/80 (BP Location: Right Arm, Patient Position: Sitting, Cuff Size: Normal)   Pulse 72   Ht 5\' 10"  (1.778 m)   Wt 182 lb (82.6 kg)   SpO2 100%   BMI 26.11 kg/m     Wt Readings from Last 3 Encounters:  10/30/23 182 lb (82.6 kg)  09/05/23 189 lb 6.4 oz (85.9 kg)  08/16/23 190 lb (86.2 kg)     GEN: no distress CARD: Irregularly irregular, No MRG RESP: No IWOB. CTAB.        ASSESSMENT AND PLAN:    1. Persistent atrial fibrillation (HCC)   2. History of CVA (cerebrovascular accident)   3. Essential hypertension     #Persistent atrial fibrillation True chronicity is unknown.  His last known sinus rhythm was in 2018.  He is asymptomatic.  He has normal ejection fraction. I discussed treatment options with the patient.  I am concerned about his ability to maintain sinus rhythm given the severity of his left atrial dilation and chronicity of A-fib.  I would like to start with an antiarrhythmic drug to see if he will maintain sinus rhythm.  I discussed options including flecainide, Multaq, Tikosyn and amiodarone.  He is not a candidate for Tikosyn given baseline QTc prolongation.  He is too young for amiodarone especially in the absence of symptoms.  We will start flecainide 100 mg by mouth twice daily.  He will continue metoprolol  succinate.  Continue Eliquis  for stroke prophylaxis  Bring back for ETT in 7 to 10 days.  Will plan for cardioversion in about 2 weeks if he remains in atrial fibrillation.  I discussed the cardioversion procedure in detail with the patient including the risks and he wishes to proceed.  Follow-up in 6 to 8 weeks.  If he maintains sinus rhythm, can revisit catheter ablation +/-Watchman.  #Hypertension Above goal today.  Recommend checking blood pressures 1-2 times per week at home and recording the values.  Recommend bringing these recordings to the primary care  physician.     Signed, Leanora Prophet. Marven Slimmer, MD, [facility], [facility] 10/30/2023 4:40 PM    Electrophysiology [facility]

## 2023-10-30 NOTE — Progress Notes (Signed)
 Electrophysiology Office Note:    Date:  10/30/2023   ID:  Samuel Clarke, DOB [birth year], MRN [id number]  [facility] Cardiologist:  Oneil Bigness, MD  [facility] Electrophysiologist:  Boyce Byes, MD   Referring MD: German Koller, MD   Chief Complaint: Atrial fibrillation  History of Present Illness:    Samuel Clarke is a 63 year old man who I am seeing today for an evaluation of persistent atrial fibrillation at the request of Dr. Rolm Clos.  The patient has a history of persistent atrial fibrillation, hypertension, hyperlipidemia, stroke, gastrointestinal stromal tumor, diabetes and prior alcohol abuse.  He has had prior cardioversion for his atrial fibrillation.  He was hospitalized in October 2024 with an acute stroke and was found to be in atrial fibrillation.  His EF was normal.  He received a cardioversion in January when he presented for follow-up in February with [facility], he was back in atrial fibrillation.  He saw a [place] in the A-fib clinic September 05, 2023.  He had a sleep study that was negative for sleep apnea.  At the appointment with Willard Harman he was unaware of his arrhythmia but remained in atrial fibrillation.  The patient is concerned about his risk of bleeding given GI stromal tumor.  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  Today he confirms the above and tells me that he is symptomatic with decreased exercise tolerance while in atrial fibrillation.     Their past medical, social and family history was reviewed.   ROS:   Please see the history of present illness.    All other systems reviewed and are negative.  EKGs/Labs/Other Studies Reviewed:    The following studies were reviewed today:  October 2024 echo EF 55% RV normal Severely dilated left atrium Trivial MR  September 05, 2023 EKG reviewed and shows atrial fibrillation, ventricular rate 73 bpm May 06, 2023 EKG shows atrial fibrillation  February 04, 2017 EKG shows sinus rhythm.  QTc 473 ms       Physical Exam:    VS:  BP (!) 146/80 (BP Location: Right Arm, Patient Position: Sitting, Cuff Size: Normal)   Pulse 72   Ht 5\' 10"  (1.778 m)   Wt 182 lb (82.6 kg)   SpO2 100%   BMI 26.11 kg/m     Wt Readings from Last 3 Encounters:  10/30/23 182 lb (82.6 kg)  09/05/23 189 lb 6.4 oz (85.9 kg)  08/16/23 190 lb (86.2 kg)     GEN: no distress CARD: Irregularly irregular, No MRG RESP: No IWOB. CTAB.        ASSESSMENT AND PLAN:    1. Persistent atrial fibrillation (HCC)   2. History of CVA (cerebrovascular accident)   3. Essential hypertension     #Persistent atrial fibrillation True chronicity is unknown.  His last known sinus rhythm was in 2018.  He is asymptomatic.  He has normal ejection fraction. I discussed treatment options with the patient.  I am concerned about his ability to maintain sinus rhythm given the severity of his left atrial dilation and chronicity of A-fib.  I would like to start with an antiarrhythmic drug to see if he will maintain sinus rhythm.  I discussed options including flecainide, Multaq, Tikosyn and amiodarone.  He is not a candidate for Tikosyn given baseline QTc prolongation.  He is too young for amiodarone especially in the absence of symptoms.  We will start flecainide 100 mg by mouth twice daily.  He will continue metoprolol  succinate.  Continue Eliquis  for stroke prophylaxis  Bring back for ETT in 7 to 10 days.  Will plan for cardioversion in about 2 weeks if he remains in atrial fibrillation.  I discussed the cardioversion procedure in detail with the patient including the risks and he wishes to proceed.  Follow-up in 6 to 8 weeks.  If he maintains sinus rhythm, can revisit catheter ablation +/-Watchman.  #Hypertension Above goal today.  Recommend checking blood pressures 1-2 times per week at home and recording the values.  Recommend bringing these recordings to the primary care  physician.     Signed, Leanora Prophet. Marven Slimmer, MD, [facility], [facility] 10/30/2023 4:40 PM    Electrophysiology [facility]

## 2023-10-30 NOTE — Patient Instructions (Signed)
 Medication Instructions:  Your physician has recommended you make the following change in your medication:  1) START taking flecainide 100 mg twice daily  *If you need a refill on your cardiac medications before your next appointment, please call your pharmacy*  Testing/Procedures: Exercise Treadmill Test - in 7-10 days Your physician has requested that you have an exercise tolerance test. For further information please visit [email or web address] Please also follow instruction sheet, as given.  Cardioversion - in two weeks Your physician has recommended that you have a Cardioversion (DCCV). Electrical Cardioversion uses a jolt of electricity to your heart either through paddles or wired patches attached to your chest. This is a controlled, usually prescheduled, procedure. Defibrillation is done under light anesthesia in the hospital, and you usually go home the day of the procedure. This is done to get your heart back into a normal rhythm. You are not awake for the procedure. Please see the instruction sheet given to you today.  Follow-Up: At [facility], you and your health needs are our priority.  As part of our continuing mission to provide you with exceptional heart care, our providers are all part of one team.  This team includes your primary Cardiologist (physician) and Advanced Practice Providers or APPs (Physician Assistants and Nurse Practitioners) who all work together to provide you with the care you need, when you need it.  Your next appointment:    6-8 week follow up with Dr Marven Slimmer - Wednesday, July 16th 2025 at 3pm.     Dear Samuel Clarke  You are scheduled for a Cardioversion on Friday, May 23 with Dr. Alvis Ba.  Please arrive at the [facility] (Main Entrance A) at [facility]: [address] [place], [place] [address] at 8:00 AM (This time is 1 hour(s) before your procedure to ensure your preparation).   Free valet parking service is available. You will  check in at ADMITTING.   *Please Note: You will receive a call the day before your procedure to confirm the appointment time. That time may have changed from the original time based on the schedule for that day.*   DIET:  Nothing to eat or drink after midnight except a sip of water with medications (see medication instructions below)  MEDICATION INSTRUCTIONS: The morning of your procedure you may take your morning medications with enough water to get them down safely.  Do not miss any doses of your Eliquis !  !!IF ANY NEW MEDICATIONS ARE STARTED AFTER TODAY, PLEASE NOTIFY YOUR PROVIDER AS SOON AS POSSIBLE!!   Continue taking your anticoagulant (blood thinner): Apixaban  (Eliquis ).  You will need to continue this after your procedure until you are told by your provider that it is safe to stop.    LABS: You may come for labs between Monday, May 19th and Wednesday, May 21st.  Come to the lab at the [facility] ([address], [place], 1st Floor) between the hours of 8:00 am and 4:30 pm. You do NOT have to be fasting.  FYI:  For your safety, and to allow us  to monitor your vital signs accurately during the surgery/procedure we request: If you have artificial nails, gel coating, SNS etc, please have those removed prior to your surgery/procedure. Not having the nail coverings /polish removed may result in cancellation or delay of your surgery/procedure.  Your support person will be asked to wait in the waiting room during your procedure.  It is OK to have someone drop  you off and come back when you are ready to be discharged.  You cannot drive after the procedure and will need someone to drive you home.  Bring your insurance cards.  *Special Note: Every effort is made to have your procedure done on time. Occasionally there are emergencies that occur at the hospital that may cause delays. Please be patient if a delay does occur.

## 2023-10-31 NOTE — Telephone Encounter (Signed)
 Attempted phone call to pt to review cardioversion instructions.  Left voicemail message advising instructions will be placed on MyChart and pt to call [phone] with any further questions.

## 2023-11-05 ENCOUNTER — Encounter (HOSPITAL_COMMUNITY): Payer: Self-pay

## 2023-11-07 ENCOUNTER — Telehealth: Payer: Self-pay | Admitting: Cardiology

## 2023-11-07 NOTE — Telephone Encounter (Signed)
 Patient called because Stress test has been changed to November 27, 2023.  At present time his cardioversion is schedule 11/15/23, He wanted to know if cardioversion need to change to after you received results of Stress Test.

## 2023-11-07 NOTE — Telephone Encounter (Signed)
 Pt said someone called him and r/s his stress test. He wants to know if the cardioversion needs to be r/s now?

## 2023-11-11 ENCOUNTER — Ambulatory Visit (HOSPITAL_COMMUNITY)
Admission: RE | Admit: 2023-11-11 | Discharge: 2023-11-11 | Disposition: A | Discharge status: Home / Self Care | Source: Ambulatory Visit | Attending: Cardiology | Admitting: Cardiology

## 2023-11-11 DIAGNOSIS — I4819 Other persistent atrial fibrillation: Secondary | ICD-10-CM | POA: Insufficient documentation

## 2023-11-11 DIAGNOSIS — I10 Essential (primary) hypertension: Secondary | ICD-10-CM | POA: Insufficient documentation

## 2023-11-11 DIAGNOSIS — Z8673 Personal history of transient ischemic attack (TIA), and cerebral infarction without residual deficits: Secondary | ICD-10-CM | POA: Diagnosis present

## 2023-11-11 DIAGNOSIS — I4891 Unspecified atrial fibrillation: Secondary | ICD-10-CM | POA: Diagnosis present

## 2023-11-11 DIAGNOSIS — Z136 Encounter for screening for cardiovascular disorders: Secondary | ICD-10-CM | POA: Diagnosis not present

## 2023-11-11 LAB — EXERCISE TOLERANCE TEST
Angina Index: 0
Duke Treadmill Score: 5
Estimated workload: 7
Exercise duration (min): 5 min
Peak HR: 144 {beats}/min
Percent HR: 91 %
ST Depression (mm): 0 mm

## 2023-11-12 ENCOUNTER — Ambulatory Visit: Payer: Self-pay | Admitting: Cardiology

## 2023-11-13 ENCOUNTER — Ambulatory Visit (HOSPITAL_COMMUNITY)

## 2023-11-14 DIAGNOSIS — E875 Hyperkalemia: Secondary | ICD-10-CM | POA: Diagnosis not present

## 2023-11-14 DIAGNOSIS — I10 Essential (primary) hypertension: Secondary | ICD-10-CM | POA: Diagnosis not present

## 2023-11-14 NOTE — Progress Notes (Signed)
 Pt called for pre procedure instructions.  Left message on identified machine. Arrival time 0745 NPO after midnight explained Instructed to take am meds with sip of water and confirmed blood thinner consistency Instructed pt need for ride home tomorrow and have responsible adult with them for 24 hrs post procedure.

## 2023-11-15 ENCOUNTER — Other Ambulatory Visit: Payer: Self-pay

## 2023-11-15 ENCOUNTER — Ambulatory Visit (HOSPITAL_BASED_OUTPATIENT_CLINIC_OR_DEPARTMENT_OTHER): Admitting: Anesthesiology

## 2023-11-15 ENCOUNTER — Ambulatory Visit (HOSPITAL_COMMUNITY)
Admission: RE | Admit: 2023-11-15 | Discharge: 2023-11-15 | Disposition: A | Discharge status: Home / Self Care | Attending: Cardiovascular Disease | Admitting: Cardiovascular Disease

## 2023-11-15 ENCOUNTER — Encounter (HOSPITAL_COMMUNITY)
Admission: RE | Disposition: A | Discharge status: Home / Self Care | Payer: Self-pay | Source: Home / Self Care | Attending: Cardiovascular Disease

## 2023-11-15 ENCOUNTER — Ambulatory Visit (HOSPITAL_COMMUNITY): Admitting: Anesthesiology

## 2023-11-15 ENCOUNTER — Ambulatory Visit: Payer: Self-pay | Admitting: Emergency Medicine

## 2023-11-15 ENCOUNTER — Encounter (HOSPITAL_COMMUNITY): Payer: Self-pay | Admitting: Cardiovascular Disease

## 2023-11-15 DIAGNOSIS — I4819 Other persistent atrial fibrillation: Secondary | ICD-10-CM | POA: Diagnosis not present

## 2023-11-15 DIAGNOSIS — Z01818 Encounter for other preprocedural examination: Secondary | ICD-10-CM

## 2023-11-15 DIAGNOSIS — Z79899 Other long term (current) drug therapy: Secondary | ICD-10-CM | POA: Insufficient documentation

## 2023-11-15 DIAGNOSIS — Z8673 Personal history of transient ischemic attack (TIA), and cerebral infarction without residual deficits: Secondary | ICD-10-CM | POA: Insufficient documentation

## 2023-11-15 DIAGNOSIS — I4891 Unspecified atrial fibrillation: Secondary | ICD-10-CM | POA: Diagnosis not present

## 2023-11-15 DIAGNOSIS — I10 Essential (primary) hypertension: Secondary | ICD-10-CM

## 2023-11-15 DIAGNOSIS — Z7901 Long term (current) use of anticoagulants: Secondary | ICD-10-CM | POA: Insufficient documentation

## 2023-11-15 DIAGNOSIS — E119 Type 2 diabetes mellitus without complications: Secondary | ICD-10-CM | POA: Insufficient documentation

## 2023-11-15 DIAGNOSIS — I639 Cerebral infarction, unspecified: Secondary | ICD-10-CM | POA: Diagnosis not present

## 2023-11-15 HISTORY — PX: CARDIOVERSION: EP1203

## 2023-11-15 LAB — BASIC METABOLIC PANEL WITH GFR
BUN/Creatinine Ratio: 13 (ref 10–24)
BUN: 12 mg/dL (ref 8–27)
CO2: 21 mmol/L (ref 20–29)
Calcium: 9.4 mg/dL (ref 8.6–10.2)
Chloride: 104 mmol/L (ref 96–106)
Creatinine, Ser: 0.92 mg/dL (ref 0.76–1.27)
Glucose: 94 mg/dL (ref 70–99)
Potassium: 5 mmol/L (ref 3.5–5.2)
Sodium: 141 mmol/L (ref 134–144)
eGFR: 93 mL/min/{1.73_m2} (ref >59)

## 2023-11-15 SURGERY — CARDIOVERSION (CATH LAB)
Anesthesia: General

## 2023-11-15 MED ORDER — PROPOFOL 10 MG/ML IV BOLUS
INTRAVENOUS | Status: DC | PRN
  Administered 2023-11-15: 60 mg via INTRAVENOUS

## 2023-11-15 MED ORDER — SODIUM CHLORIDE 0.9 % IV SOLN
INTRAVENOUS | Status: DC
Start: 2023-11-15 — End: 2023-11-15

## 2023-11-15 SURGICAL SUPPLY — 1 items: PAD DEFIB RADIO PHYSIO CONN (PAD) ×1 IMPLANT

## 2023-11-15 NOTE — Transfer of Care (Signed)
 Immediate Anesthesia Transfer of Care Note  Patient: Samuel Clarke  Procedure(s) Performed: CARDIOVERSION  Patient Location: PACU and Cath Lab  Anesthesia Type:General  Level of Consciousness: awake  Airway & Oxygen Therapy: Patient Spontanous Breathing and Patient connected to nasal cannula oxygen  Post-op Assessment: Report given to RN and Post -op Vital signs reviewed and stable  Post vital signs: Reviewed and stable  Last Vitals:  Vitals Value Taken Time  BP    Temp    Pulse 62 11/15/23 0822  Resp 13 11/15/23 0822  SpO2 100 % 11/15/23 0822  Vitals shown include unfiled device data.  Last Pain:  Vitals:   11/15/23 0708  TempSrc: Temporal  PainSc: 0-No pain         Complications: There were no known notable events for this encounter.

## 2023-11-15 NOTE — Op Note (Signed)
 Procedure: Electrical Cardioversion Indications:  Atrial Fibrillation  Procedure Details:  Consent: Risks of procedure as well as the alternatives and risks of each were explained to the (patient/caregiver).  Consent for procedure obtained.  Time Out: Verified patient identification, verified procedure, site/side was marked, verified correct patient position, special equipment/implants available, medications/allergies/relevent history reviewed, required imaging and test results available.  Performed  Patient placed on cardiac monitor, pulse oximetry, supplemental oxygen as necessary.  Sedation given: propofol  60 mg IV, Dr. Lavada Porteous Pacer pads placed anterior and posterior chest.  Cardioverted 1 time(s).  Cardioversion with synchronized biphasic 200J shock.  Evaluation: Findings: Post procedure EKG shows: NSR Complications: None Patient did tolerate procedure well.  Time Spent Directly with the Patient:  30 minutes   Shantella Blubaugh 11/15/2023, 8:32 AM

## 2023-11-15 NOTE — Anesthesia Preprocedure Evaluation (Addendum)
 Anesthesia Evaluation  Patient identified by MRN, date of birth, ID band Patient awake    Reviewed: Allergy & Precautions, NPO status , Patient's Chart, lab work & pertinent test results  Airway Mallampati: II  TM Distance: >3 FB Neck ROM: Full    Dental no notable dental hx.    Pulmonary neg pulmonary ROS   Pulmonary exam normal        Cardiovascular hypertension, Pt. on medications and Pt. on home beta blockers + dysrhythmias Atrial Fibrillation  Rhythm:Irregular Rate:Normal  ECHO:  1. Left ventricular ejection fraction, by estimation, is 55 to 60%. The  left ventricle has normal function. The left ventricle has no regional  wall motion abnormalities. There is mild concentric left ventricular  hypertrophy. Left ventricular diastolic  function could not be evaluated.   2. Right ventricular systolic function is normal. The right ventricular  size is normal. There is normal pulmonary artery systolic pressure.   3. Left atrial size was severely dilated.   4. The mitral valve is normal in structure. Trivial mitral valve  regurgitation. No evidence of mitral stenosis.   5. The aortic valve is normal in structure. Aortic valve regurgitation is  not visualized. No aortic stenosis is present.   6. The inferior vena cava is normal in size with greater than 50%  respiratory variability, suggesting right atrial pressure of 3 mmHg.     Neuro/Psych CVA  negative psych ROS   GI/Hepatic negative GI ROS, Neg liver ROS,,,  Endo/Other  diabetes    Renal/GU negative Renal ROS  negative genitourinary   Musculoskeletal negative musculoskeletal ROS (+)    Abdominal Normal abdominal exam  (+)   Peds  Hematology  (+) Blood dyscrasia, anemia , REFUSES BLOOD PRODUCTS, JEHOVAH'S WITNESSLab Results      Component                Value               Date                      WBC                      6.0                 07/22/2023                 HGB                      13.4                07/22/2023                HCT                      41.0                07/22/2023                MCV                      88                  07/22/2023                PLT  272                 07/22/2023             Lab Results      Component                Value               Date                      NA                       141                 11/14/2023                K                        5.0                 11/14/2023                CO2                      21                  11/14/2023                GLUCOSE                  94                  11/14/2023                BUN                      12                  11/14/2023                CREATININE               0.92                11/14/2023                CALCIUM                   9.4                 11/14/2023                EGFR                     93                  11/14/2023                GFRNONAA                 >60                 04/01/2023              Anesthesia Other Findings   Reproductive/Obstetrics  Anesthesia Physical Anesthesia Plan  ASA: 3  Anesthesia Plan: General   Post-op Pain Management:    Induction: Intravenous  PONV Risk Score and Plan: 2 and Treatment may vary due to age or medical condition  Airway Management Planned: Mask  Additional Equipment: None  Intra-op Plan:   Post-operative Plan:   Informed Consent: I have reviewed the patients History and Physical, chart, labs and discussed the procedure including the risks, benefits and alternatives for the proposed anesthesia with the patient or authorized representative who has indicated his/her understanding and acceptance.     Dental advisory given  Plan Discussed with: CRNA  Anesthesia Plan Comments:        Anesthesia Quick Evaluation

## 2023-11-15 NOTE — Interval H&P Note (Signed)
 History and Physical Interval Note:  11/15/2023 7:25 AM  Samuel Clarke  has presented today for surgery, with the diagnosis of afib.  The various methods of treatment have been discussed with the patient and family. After consideration of risks, benefits and other options for treatment, the patient has consented to  Procedure(s): CARDIOVERSION (N/A) as a surgical intervention.  The patient's history has been reviewed, patient examined, no change in status, stable for surgery.  I have reviewed the patient's chart and labs.  Questions were answered to the patient's satisfaction.     Lyza Houseworth

## 2023-11-15 NOTE — Discharge Instructions (Signed)

## 2023-11-15 NOTE — Anesthesia Postprocedure Evaluation (Signed)
 Anesthesia Post Note  Patient: Samuel Clarke  Procedure(s) Performed: CARDIOVERSION     Patient location during evaluation: PACU Anesthesia Type: General Level of consciousness: awake and alert Pain management: pain level controlled Vital Signs Assessment: post-procedure vital signs reviewed and stable Respiratory status: spontaneous breathing, nonlabored ventilation, respiratory function stable and patient connected to nasal cannula oxygen Cardiovascular status: blood pressure returned to baseline and stable Postop Assessment: no apparent nausea or vomiting Anesthetic complications: no   There were no known notable events for this encounter.  Last Vitals:  Vitals:   11/15/23 0840 11/15/23 0850  BP: (!) 145/75 137/67  Pulse: (!) 57 (!) 53  Resp: 18 13  Temp:    SpO2: 100% 100%    Last Pain:  Vitals:   11/15/23 0835  TempSrc: Temporal  PainSc: 0-No pain                 Theotis Flake P Elanora Quin

## 2023-11-18 NOTE — Progress Notes (Deleted)
  Cardiology Office Note:  .   Date:  11/18/2023  ID:  Samuel Clarke, DOB 11/01/1960, MRN [id number] PCP: Juvenal Opoka  [facility] Providers Cardiologist:  Oneil Bigness, MD Electrophysiologist:  Boyce Byes, MD { Click to update primary MD,subspecialty MD or APP then REFRESH:1}   History of Present Illness: .   No chief complaint on file.   Samuel Clarke is a 63 y.o. male with history of persistent Afib, CVA, HTN, HLD, DM who presents for follow-up.      Problem List Persistent Afib CVA -acute R frontal gyrus (04/02/2023) -2/2 Afib  GIST DM -A1c 7.1 HTN HLD -T chol 160, HDL 66, LDL 81, TG 63    ROS: All other ROS reviewed and negative. Pertinent positives noted in the HPI.     Studies Reviewed: Aaron Aas       TTE 04/01/2023  1. Left ventricular ejection fraction, by estimation, is 55 to 60%. The  left ventricle has normal function. The left ventricle has no regional  wall motion abnormalities. There is mild concentric left ventricular  hypertrophy. Left ventricular diastolic  function could not be evaluated.   2. Right ventricular systolic function is normal. The right ventricular  size is normal. There is normal pulmonary artery systolic pressure.   3. Left atrial size was severely dilated.   4. The mitral valve is normal in structure. Trivial mitral valve  regurgitation. No evidence of mitral stenosis.   5. The aortic valve is normal in structure. Aortic valve regurgitation is  not visualized. No aortic stenosis is present.   6. The inferior vena cava is normal in size with greater than 50%  respiratory variability, suggesting right atrial pressure of 3 mmHg.  Physical Exam:   VS:  There were no vitals taken for this visit.   Wt Readings from Last 3 Encounters:  11/11/23 182 lb (82.6 kg)  10/30/23 182 lb (82.6 kg)  09/05/23 189 lb 6.4 oz (85.9 kg)    GEN: Well nourished, well developed in no acute distress NECK: No JVD; No carotid bruits CARDIAC:  ***RRR, no murmurs, rubs, gallops RESPIRATORY:  Clear to auscultation without rales, wheezing or rhonchi  ABDOMEN: Soft, non-tender, non-distended EXTREMITIES:  No edema; No deformity  ASSESSMENT AND PLAN: .   ***    {Are you ordering a CV Procedure (e.g. stress test, cath, DCCV, TEE, etc)?   Press F2        :[id number]}   Follow-up: No follow-ups on file.  Time Spent with Patient: I have spent a total of *** minutes caring for this patient today face to face, ordering and reviewing labs/tests, reviewing prior records/medical history, examining the patient, establishing an assessment and plan, communicating results/findings to the patient/family, and documenting in the medical record.   Signed, Gigi Kyle. Rolm Clos, MD, [facility]  [facility]  [address] [place], [place] [address] [phone]  12:16 PM

## 2023-11-20 ENCOUNTER — Ambulatory Visit: Payer: 59 | Admitting: Cardiovascular Disease

## 2023-11-20 DIAGNOSIS — I10 Essential (primary) hypertension: Secondary | ICD-10-CM

## 2023-11-20 DIAGNOSIS — E785 Hyperlipidemia, unspecified: Secondary | ICD-10-CM

## 2023-11-20 DIAGNOSIS — I4819 Other persistent atrial fibrillation: Secondary | ICD-10-CM

## 2023-11-20 DIAGNOSIS — E119 Type 2 diabetes mellitus without complications: Secondary | ICD-10-CM

## 2023-11-20 DIAGNOSIS — Z8673 Personal history of transient ischemic attack (TIA), and cerebral infarction without residual deficits: Secondary | ICD-10-CM

## 2023-11-27 ENCOUNTER — Encounter (HOSPITAL_COMMUNITY)

## 2023-12-08 NOTE — Progress Notes (Unsigned)
 Cardiology Office Note:  .   Date:  12/09/2023  ID:  Samuel Clarke, DOB 10/02/1960, MRN [id number] PCP: Samuel Clarke  [facility] Providers Cardiologist:  Samuel Bigness, MD Electrophysiologist:  Samuel Byes, MD {  History of Present Illness: .   Samuel Clarke is a 63 y.o. male with history of persistent atrial fibrillation status post DCCV 10/2023, hypertension, hyperlipidemia, CVA, GI stromal tumor, diabetes, prior alcohol abuse, Jehovah's Witness.     Persistent atrial fibrillation Initial diagnosis in setting of CVA October 2024.  EF normal. Cardioverted January 2025 Back in A-fib February 2025 Started on flecainide  100 mg twice daily by EP May 2025.  Not felt to be candidate for amiodarone given young age and QTc limited other therapy.  Plan for DCCV.  ETT normal. DCCV 11/16/2023.     Patient with history of persistent atrial fibrillation with DCCV 01/24/2023 and most recently Nov 16, 2023.  He presents today for follow-up after his cardioversion and is back in atrial fibrillation with heart rates in the 60s.  He has a Kardia mobile device and was in atrial fibrillation less than 24 hours after his DCCV.  Overall he is very minimally symptomatic from this.  Denies any palpitations, chest pain.  The only difference he really feels it is with maximum exertion when walking up a hill, he feels slightly more short of breath than before.  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   ROS: Denies: Chest pain, shortness of breath, orthopnea, peripheral edema, palpitations, decreased exercise intolerance, fatigue, lightheadedness.   Studies Reviewed: Samuel Clarke    EKG Interpretation Date/Time:  Monday December 09 2023 08:18:07 EDT Ventricular Rate:  62 PR Interval:    QRS Duration:  86 QT Interval:  452 QTC Calculation: 458 R Axis:   75  Text  Interpretation: Atrial fibrillation When compared with ECG of 15-Nov-2023 08:36, Atrial fibrillation has replaced Sinus rhythm Confirmed by Samuel Clarke [phone]) on 12/09/2023 8:23:29 AM    Risk Assessment/Calculations:    CHA2DS2-VASc Score = 4   This indicates a 4.8% annual risk of stroke. The patient's score is based upon: CHF History: 0 HTN History: 1 Diabetes History: 1 Stroke History: 2 Vascular Disease History: 0 Age Score: 0 Gender Score: 0  Physical Exam:   VS:  BP 136/80   Ht 5' 10 (1.778 m)   Wt 189 lb (85.7 kg)   SpO2 98%   BMI 27.12 kg/m    Wt Readings from Last 3 Encounters:  12/09/23 189 lb (85.7 kg)  11/11/23 182 lb (82.6 kg)  10/30/23 182 lb (82.6 kg)    GEN: Well nourished, well developed in no acute distress NECK: No JVD; No carotid bruits CARDIAC: IRRR, no murmurs, rubs, gallops RESPIRATORY:  Clear to auscultation without rales, wheezing or rhonchi  ABDOMEN: Soft, non-tender, non-distended EXTREMITIES:  No edema; No deformity   ASSESSMENT AND PLAN: .    Persistent atrial fibrillation Patient has failed cardioversion twice this year even on flecainide .  Overall minimally symptomatic.  Fortunately rate controlled with heart rates in the 60s.  Probably need to prioritize rate control strategy now. Continue with Eliquis  5 mg twice daily, tolerating this well and compliant. Stopping flecainide .  He is likely permanent atrial fibrillation.  Will defer to EP to make that formal diagnosis unless they want to try ablation  or something else although likely unsuccessful. Sleep study normal 2025. Normal echocardiogram October 2024 with severely dilated LA.  No significant valve disease. Get TSH.  Hypertension Decently controlled today.  136/80.  He did not bring blood pressure log.  Encouraged him to bring 1 to upcoming appointment with PCP once he gets established.  Take twice a day for next 2 weeks and bring log to upcoming appointment  HLD LDL 3 months ago was  81.  LDL goal is less than 70 Repeat lipid panel today, continue with rosuvastatin  20 mg but may need to increase.  Type 2 diabetes  A1c 7.1%.  Decent control.  Making referral for PCP so they can follow-up on this.     Dispo: He will see Dr. Marven Clarke in 1 month.  Determine if ablation/Watchman is still going to be pursued.  Signed, Samuel Cart, PA-C

## 2023-12-09 ENCOUNTER — Encounter: Payer: Self-pay | Admitting: Physician Assistant

## 2023-12-09 ENCOUNTER — Ambulatory Visit: Attending: Physician Assistant | Admitting: Cardiology

## 2023-12-09 VITALS — BP 136/80 | Ht 70.0 in | Wt 189.0 lb

## 2023-12-09 DIAGNOSIS — E118 Type 2 diabetes mellitus with unspecified complications: Secondary | ICD-10-CM

## 2023-12-09 DIAGNOSIS — I10 Essential (primary) hypertension: Secondary | ICD-10-CM | POA: Diagnosis not present

## 2023-12-09 DIAGNOSIS — E785 Hyperlipidemia, unspecified: Secondary | ICD-10-CM | POA: Diagnosis not present

## 2023-12-09 DIAGNOSIS — I4819 Other persistent atrial fibrillation: Secondary | ICD-10-CM | POA: Diagnosis not present

## 2023-12-09 LAB — HEMOGLOBIN A1C
Est. average glucose Bld gHb Est-mCnc: 126 mg/dL
Hgb A1c MFr Bld: 6 % — ABNORMAL HIGH (ref 4.8–5.6)

## 2023-12-09 NOTE — Patient Instructions (Signed)
 Medication Instructions:  Your physician has recommended you make the following change in your medication:   ** Stop Flecainide   *If you need a refill on your cardiac medications before your next appointment, please call your pharmacy*  Lab Work: TSH and Hgb A1-C today  If you have labs (blood work) drawn today and your tests are completely normal, you will receive your results only by: MyChart Message (if you have MyChart) OR A paper copy in the mail If you have any lab test that is abnormal or we need to change your treatment, we will call you to review the results.  Testing/Procedures: None ordered.   Follow-Up: At [facility], you and your health needs are our priority.  As part of our continuing mission to provide you with exceptional heart care, our providers are all part of one team.  This team includes your primary Cardiologist (physician) and Advanced Practice Providers or APPs (Physician Assistants and Nurse Practitioners) who all work together to provide you with the care you need, when you need it.  Your next appointment:   As scheduled with Dr Marven Slimmer  Please establish with a PCP

## 2023-12-10 ENCOUNTER — Ambulatory Visit: Payer: Self-pay | Admitting: Cardiology

## 2023-12-10 DIAGNOSIS — E782 Mixed hyperlipidemia: Secondary | ICD-10-CM

## 2023-12-10 LAB — TSH: TSH: 2.44 u[IU]/mL (ref 0.450–4.500)

## 2024-01-06 ENCOUNTER — Ambulatory Visit: Admitting: Physician Assistant

## 2024-01-08 ENCOUNTER — Encounter: Payer: Self-pay | Admitting: Cardiology

## 2024-01-08 ENCOUNTER — Ambulatory Visit: Attending: Cardiology | Admitting: Cardiology

## 2024-01-08 VITALS — BP 142/66 | HR 78 | Ht 70.0 in | Wt 187.4 lb

## 2024-01-08 DIAGNOSIS — I4821 Permanent atrial fibrillation: Secondary | ICD-10-CM | POA: Diagnosis not present

## 2024-01-08 DIAGNOSIS — I4819 Other persistent atrial fibrillation: Secondary | ICD-10-CM

## 2024-01-08 NOTE — Progress Notes (Signed)
  Electrophysiology Office Follow up Visit Note:    Date:  01/08/2024   ID:  Samuel Clarke, DOB [birth year], MRN [id number]  PCP:  Freddrick, No  [facility] Cardiologist:  Darryle ONEIDA Decent, MD  [facility] Electrophysiologist:  OLE ONEIDA HOLTS, MD    Interval History:     Samuel Clarke is a 63 y.o. male who presents for a follow up visit.    I last saw the patient Oct 30, 2023 for his atrial fibrillation.  The patient has a history of GI stromal tumor.  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  The chronicity of his atrial fibrillation was unclear at the last appointment and he only very transiently achieved sinus rhythm after cardioversion.  At the last appointment we started flecainide  and plan for ETT/cardioversion.  If he maintained sinus rhythm after cardioversion, planned to discuss ablation and possible watchman.  He did have a successful cardioversion on Nov 15, 2023.  He was back in atrial fibrillation at follow-up appointment with Marisa on December 09, 2023.       Past medical, surgical, social and family history were reviewed.  ROS:   Please see the history of present illness.    All other systems reviewed and are negative.  EKGs/Labs/Other Studies Reviewed:    The following studies were reviewed today:  Nov 11, 2023 ETT No evidence of flecainide  related pro arrhythmia or QRS widening during exercise          Physical Exam:    VS:  There were no vitals taken for this visit.    Wt Readings from Last 3 Encounters:  12/09/23 189 lb (85.7 kg)  11/11/23 182 lb (82.6 kg)  10/30/23 182 lb (82.6 kg)     GEN: no distress CARD: Irregularly irregular, No MRG RESP: No IWOB. CTAB.      ASSESSMENT:    No diagnosis found. PLAN:    In order of problems listed above:  #Permanent atrial fibrillation Has failed cardioversion on and off antiarrhythmics.  I suspect he has been in atrial fibrillation for a long  period of time.  Minimal symptoms.  Recommend continuing metoprolol .  Continue Eliquis  for stroke prophylaxis.  CHA2DS2-VASc Score = 4  The patient's score is based upon: CHF History: 0 HTN History: 1 Diabetes History: 1 Stroke History: 2 Vascular Disease History: 0 Age Score: 0 Gender Score: 0  Continue follow up with general cardiology. EP follow up as needed.   Signed, OLE HOLTS, MD, [facility] [place], [facility] 01/08/2024 11:53 AM    Electrophysiology [facility]

## 2024-01-08 NOTE — Patient Instructions (Signed)
 Medication Instructions:  Your physician recommends that you continue on your current medications as directed. Please refer to the Current Medication list given to you today.  *If you need a refill on your cardiac medications before your next appointment, please call your pharmacy*  Lab Work: None ordered.  You may go to any [facility] Location for your lab work:  [facility] - [address] ([facility]) - [address] [phone] [address]  [place] - [address]   [place]  - [address]   [place] - [address] - [address] [employer]  - [address] ([facility]   If you have labs (blood work) drawn today and your tests are completely normal, you will receive your results only by: [employer] (if you have MyChart)  If you have any lab test that is abnormal or we need to change your treatment, we will call you or send a MyChart message to review the results.  Testing/Procedures: None ordered.  Follow-Up: At [facility], you and your health needs are our priority.  As part of our continuing mission to provide you with exceptional heart care, we have created designated Provider Care Teams.  These Care Teams include your primary Cardiologist (physician) and Advanced Practice Providers (APPs -  Physician Assistants and Nurse Practitioners) who all work together to provide you with the care you need, when you need it.   Your next appointment:   As needed  The format for your next appointment:   In Person  Provider:   Ole Holts, MD or one of the following Advanced Practice Providers on your designated Care Team:   Charlies Arthur, [place] Ozell Jodie Passey, [place] Leotis Barrack, NP  Note: Remote monitoring is used to monitor your Pacemaker/ ICD from home. This monitoring reduces the number of office visits required to check  your device to one time per year. It allows us  to keep an eye on the functioning of your device to ensure it is working properly.

## 2024-07-11 ENCOUNTER — Other Ambulatory Visit: Payer: Self-pay | Admitting: Emergency Medicine

## 2024-07-11 ENCOUNTER — Other Ambulatory Visit: Payer: Self-pay | Admitting: Cardiovascular Disease
